# Patient Record
Sex: Male | Born: 1955 | Race: White | Hispanic: No | Marital: Married | Smoking: Never smoker
Health system: Southern US, Community
[De-identification: ages and names within clinical notes are randomized; demographics above are authoritative.]

## PROBLEM LIST (undated history)

## (undated) DIAGNOSIS — E785 Hyperlipidemia, unspecified: Secondary | ICD-10-CM

## (undated) DIAGNOSIS — C801 Malignant (primary) neoplasm, unspecified: Secondary | ICD-10-CM

## (undated) DIAGNOSIS — I10 Essential (primary) hypertension: Secondary | ICD-10-CM

## (undated) DIAGNOSIS — Z8719 Personal history of other diseases of the digestive system: Secondary | ICD-10-CM

## (undated) DIAGNOSIS — Z85828 Personal history of other malignant neoplasm of skin: Secondary | ICD-10-CM

## (undated) DIAGNOSIS — Z860100 Personal history of colon polyps, unspecified: Secondary | ICD-10-CM

## (undated) HISTORY — DX: Hyperlipidemia, unspecified: E78.5

## (undated) HISTORY — PX: POLYPECTOMY: SHX149

## (undated) HISTORY — DX: Essential (primary) hypertension: I10

## (undated) HISTORY — PX: COLONOSCOPY: SHX174

## (undated) HISTORY — DX: Malignant (primary) neoplasm, unspecified: C80.1

## (undated) HISTORY — DX: Personal history of colonic polyps: Z86.010

## (undated) HISTORY — DX: Personal history of other malignant neoplasm of skin: Z85.828

## (undated) HISTORY — PX: THYROID LOBECTOMY: SHX420

## (undated) HISTORY — DX: Personal history of colon polyps, unspecified: Z86.0100

## (undated) HISTORY — DX: Personal history of other diseases of the digestive system: Z87.19

---

## 2003-09-09 ENCOUNTER — Encounter: Admission: RE | Admit: 2003-09-09 | Discharge: 2003-09-09 | Payer: Self-pay | Admitting: Family Medicine

## 2003-09-09 ENCOUNTER — Encounter: Payer: Self-pay | Admitting: Family Medicine

## 2004-07-02 ENCOUNTER — Encounter: Admission: RE | Admit: 2004-07-02 | Discharge: 2004-07-02 | Payer: Self-pay | Admitting: Internal Medicine

## 2005-10-04 ENCOUNTER — Ambulatory Visit: Payer: Self-pay | Admitting: Internal Medicine

## 2005-10-21 ENCOUNTER — Ambulatory Visit: Payer: Self-pay | Admitting: Internal Medicine

## 2006-01-13 ENCOUNTER — Ambulatory Visit: Payer: Self-pay | Admitting: Internal Medicine

## 2007-03-02 ENCOUNTER — Ambulatory Visit: Payer: Self-pay | Admitting: Internal Medicine

## 2007-03-06 ENCOUNTER — Encounter: Payer: Self-pay | Admitting: Internal Medicine

## 2007-08-28 ENCOUNTER — Ambulatory Visit: Payer: Self-pay | Admitting: Internal Medicine

## 2007-09-02 LAB — CONVERTED CEMR LAB
ALT: 24 units/L (ref 0–53)
AST: 20 units/L (ref 0–37)
HDL: 41.1 mg/dL (ref >39.0)
Total CHOL/HDL Ratio: 4.6
Triglycerides: 210 mg/dL (ref 0–149)

## 2007-09-03 ENCOUNTER — Encounter (INDEPENDENT_AMBULATORY_CARE_PROVIDER_SITE_OTHER): Payer: Self-pay | Admitting: *Deleted

## 2007-11-20 ENCOUNTER — Ambulatory Visit: Payer: Self-pay | Admitting: Internal Medicine

## 2007-11-20 DIAGNOSIS — T887XXA Unspecified adverse effect of drug or medicament, initial encounter: Secondary | ICD-10-CM | POA: Insufficient documentation

## 2007-11-20 LAB — CONVERTED CEMR LAB: LDL Goal: 160 mg/dL

## 2007-12-20 DIAGNOSIS — C801 Malignant (primary) neoplasm, unspecified: Secondary | ICD-10-CM

## 2007-12-20 HISTORY — DX: Malignant (primary) neoplasm, unspecified: C80.1

## 2008-02-20 ENCOUNTER — Ambulatory Visit: Payer: Self-pay | Admitting: Internal Medicine

## 2008-02-20 LAB — CONVERTED CEMR LAB
AST: 22 units/L (ref 0–37)
Bilirubin, Direct: 0.1 mg/dL (ref 0.0–0.3)
HDL: 57.2 mg/dL (ref >39.0)
Total Bilirubin: 1 mg/dL (ref 0.3–1.2)
Total CHOL/HDL Ratio: 3.3
Total CK: 144 units/L (ref 7–195)
Triglycerides: 67 mg/dL (ref 0–149)
VLDL: 13 mg/dL (ref 0–40)

## 2008-02-25 ENCOUNTER — Ambulatory Visit: Payer: Self-pay | Admitting: Internal Medicine

## 2008-02-25 DIAGNOSIS — E782 Mixed hyperlipidemia: Secondary | ICD-10-CM

## 2008-02-25 DIAGNOSIS — M2550 Pain in unspecified joint: Secondary | ICD-10-CM | POA: Insufficient documentation

## 2008-02-25 DIAGNOSIS — Z85828 Personal history of other malignant neoplasm of skin: Secondary | ICD-10-CM | POA: Insufficient documentation

## 2008-02-25 LAB — CONVERTED CEMR LAB
LDL Goal: 130 mg/dL
Sed Rate: 14 mm/hr (ref 0–20)

## 2008-02-26 ENCOUNTER — Encounter (INDEPENDENT_AMBULATORY_CARE_PROVIDER_SITE_OTHER): Payer: Self-pay | Admitting: *Deleted

## 2008-07-24 ENCOUNTER — Telehealth (INDEPENDENT_AMBULATORY_CARE_PROVIDER_SITE_OTHER): Payer: Self-pay | Admitting: *Deleted

## 2008-09-16 ENCOUNTER — Telehealth (INDEPENDENT_AMBULATORY_CARE_PROVIDER_SITE_OTHER): Payer: Self-pay | Admitting: *Deleted

## 2009-01-02 ENCOUNTER — Telehealth (INDEPENDENT_AMBULATORY_CARE_PROVIDER_SITE_OTHER): Payer: Self-pay | Admitting: *Deleted

## 2009-01-21 ENCOUNTER — Telehealth (INDEPENDENT_AMBULATORY_CARE_PROVIDER_SITE_OTHER): Payer: Self-pay | Admitting: *Deleted

## 2009-01-22 ENCOUNTER — Telehealth (INDEPENDENT_AMBULATORY_CARE_PROVIDER_SITE_OTHER): Payer: Self-pay | Admitting: *Deleted

## 2009-01-26 ENCOUNTER — Ambulatory Visit: Payer: Self-pay | Admitting: Internal Medicine

## 2009-01-26 DIAGNOSIS — Z860100 Personal history of colon polyps, unspecified: Secondary | ICD-10-CM | POA: Insufficient documentation

## 2009-01-26 DIAGNOSIS — I10 Essential (primary) hypertension: Secondary | ICD-10-CM | POA: Insufficient documentation

## 2009-02-02 ENCOUNTER — Encounter (INDEPENDENT_AMBULATORY_CARE_PROVIDER_SITE_OTHER): Payer: Self-pay | Admitting: *Deleted

## 2009-02-06 ENCOUNTER — Ambulatory Visit: Payer: Self-pay | Admitting: Gastroenterology

## 2009-02-25 ENCOUNTER — Ambulatory Visit: Payer: Self-pay | Admitting: Gastroenterology

## 2009-02-25 ENCOUNTER — Encounter: Payer: Self-pay | Admitting: Gastroenterology

## 2009-02-27 ENCOUNTER — Encounter: Payer: Self-pay | Admitting: Gastroenterology

## 2010-02-04 ENCOUNTER — Telehealth (INDEPENDENT_AMBULATORY_CARE_PROVIDER_SITE_OTHER): Payer: Self-pay | Admitting: *Deleted

## 2010-03-02 ENCOUNTER — Ambulatory Visit: Payer: Self-pay | Admitting: Internal Medicine

## 2010-03-02 DIAGNOSIS — K219 Gastro-esophageal reflux disease without esophagitis: Secondary | ICD-10-CM

## 2010-11-15 ENCOUNTER — Ambulatory Visit: Payer: Self-pay | Admitting: Internal Medicine

## 2010-11-15 DIAGNOSIS — J069 Acute upper respiratory infection, unspecified: Secondary | ICD-10-CM | POA: Insufficient documentation

## 2010-11-15 DIAGNOSIS — H9209 Otalgia, unspecified ear: Secondary | ICD-10-CM | POA: Insufficient documentation

## 2010-11-15 DIAGNOSIS — M542 Cervicalgia: Secondary | ICD-10-CM

## 2010-11-16 LAB — CONVERTED CEMR LAB
Basophils Relative: 0.4 % (ref 0.0–3.0)
Eosinophils Relative: 2.3 % (ref 0.0–5.0)
HCT: 42.8 % (ref 39.0–52.0)
Hemoglobin: 14.6 g/dL (ref 13.0–17.0)
Lymphs Abs: 1.3 10*3/uL (ref 0.7–4.0)
MCV: 93.7 fL (ref 78.0–100.0)
Monocytes Absolute: 0.5 10*3/uL (ref 0.1–1.0)
RBC: 4.57 M/uL (ref 4.22–5.81)
Total CK: 155 units/L (ref 7–232)
WBC: 7.2 10*3/uL (ref 4.5–10.5)

## 2011-01-07 ENCOUNTER — Encounter: Payer: Self-pay | Admitting: Internal Medicine

## 2011-01-16 LAB — CONVERTED CEMR LAB
ALT: 27 units/L (ref 0–40)
AST: 19 units/L (ref 0–37)
AST: 25 units/L (ref 0–37)
AST: 27 units/L (ref 0–37)
Albumin: 3.6 g/dL (ref 3.5–5.2)
Alkaline Phosphatase: 68 units/L (ref 39–117)
Alkaline Phosphatase: 77 units/L (ref 39–117)
Alkaline Phosphatase: 91 units/L (ref 39–117)
BUN: 12 mg/dL (ref 6–23)
Basophils Absolute: 0 10*3/uL (ref 0.0–0.1)
Basophils Absolute: 0 10*3/uL (ref 0.0–0.1)
Basophils Absolute: 0.1 10*3/uL (ref 0.0–0.1)
Basophils Relative: 0.2 % (ref 0.0–1.0)
Bilirubin, Direct: 0.1 mg/dL (ref 0.0–0.3)
CO2: 30 meq/L (ref 19–32)
Calcium: 8.9 mg/dL (ref 8.4–10.5)
Calcium: 9 mg/dL (ref 8.4–10.5)
Chloride: 103 meq/L (ref 96–112)
Chloride: 106 meq/L (ref 96–112)
Cholesterol: 183 mg/dL (ref 0–200)
Creatinine, Ser: 1 mg/dL (ref 0.4–1.5)
Creatinine, Ser: 1 mg/dL (ref 0.4–1.5)
Eosinophils Absolute: 0.1 10*3/uL (ref 0.0–0.7)
GFR calc non Af Amer: 83 mL/min
GFR calc non Af Amer: 93.16 mL/min (ref >60)
Glucose, Bld: 80 mg/dL (ref 70–99)
HDL goal, serum: 40 mg/dL
HDL: 54 mg/dL (ref >39.00)
HDL: 57.7 mg/dL (ref >39.0)
Hemoglobin: 14 g/dL (ref 13.0–17.0)
Hemoglobin: 14.4 g/dL (ref 13.0–17.0)
LDL Cholesterol: 113 mg/dL — ABNORMAL HIGH (ref 0–99)
Lymphocytes Relative: 32.5 % (ref 12.0–46.0)
Lymphocytes Relative: 33.8 % (ref 12.0–46.0)
MCHC: 33.9 g/dL (ref 30.0–36.0)
MCHC: 34.4 g/dL (ref 30.0–36.0)
MCV: 91.4 fL (ref 78.0–100.0)
Monocytes Absolute: 0.7 10*3/uL (ref 0.2–0.7)
Monocytes Relative: 8.4 % (ref 3.0–11.0)
Monocytes Relative: 8.6 % (ref 3.0–12.0)
Neutro Abs: 2.8 10*3/uL (ref 1.4–7.7)
Neutrophils Relative %: 55.5 % (ref 43.0–77.0)
PSA: 0.19 ng/mL (ref 0.10–4.00)
PSA: 0.21 ng/mL (ref 0.10–4.00)
Platelets: 274 10*3/uL (ref 150.0–400.0)
Platelets: 279 10*3/uL (ref 150–400)
Platelets: 280 10*3/uL (ref 150–400)
Potassium: 4.1 meq/L (ref 3.5–5.1)
Potassium: 4.1 meq/L (ref 3.5–5.1)
RBC: 4.54 M/uL (ref 4.22–5.81)
RDW: 12.5 % (ref 11.5–14.6)
RDW: 12.8 % (ref 11.5–14.6)
Sodium: 139 meq/L (ref 135–145)
Sodium: 140 meq/L (ref 135–145)
Total Bilirubin: 0.7 mg/dL (ref 0.3–1.2)
Total Bilirubin: 0.9 mg/dL (ref 0.3–1.2)
Total Bilirubin: 1 mg/dL (ref 0.3–1.2)
Total Protein: 7 g/dL (ref 6.0–8.3)
Triglycerides: 115 mg/dL (ref 0.0–149.0)
VLDL: 12 mg/dL (ref 0–40)
VLDL: 23 mg/dL (ref 0.0–40.0)

## 2011-01-20 NOTE — Assessment & Plan Note (Signed)
Summary: med refill/kdc   Vital Signs:  Patient profile:   55 year old male Height:      70.75 inches Weight:      208.8 pounds BMI:     29.43 Temp:     98.4 degrees F oral Pulse rate:   60 / minute Resp:     14 per minute BP sitting:   128 / 84  (left arm) Cuff size:   large  Vitals Entered By: Shonna Chock (March 02, 2010 11:10 AM)  Comments REVIEWED MED LIST, PATIENT AGREED DOSE AND INSTRUCTION CORRECT    History of Present Illness: Russell Gonzalez is here for a physical; his BP has been well controlled in context of increased CVE , including  training for a 1/2 marathon.  Hypertension History:      He denies headache, chest pain, palpitations, dyspnea with exertion, orthopnea, PND, peripheral edema, visual symptoms, neurologic problems, syncope, and side effects from treatment.  Further comments include: BP averaging 120-124/78-84.        Positive major cardiovascular risk factors include male age 39 years old or older, hyperlipidemia, and hypertension.  Negative major cardiovascular risk factors include no history of diabetes, negative family history for ischemic heart disease, and non-tobacco-user status.        Further assessment for target organ damage reveals no history of ASHD, stroke/TIA, or peripheral vascular disease.     Allergies (verified): No Known Drug Allergies  Past History:  Past Medical History: Hyperlipidemia Skin cancer, PMH  of ,basal cell X 1, Dr Mayford Knife Colonic polyps,PMH  of adenomatous ,2005  & 2010 Dr Jarold Motto Hypertension GERD, PMH of  Past Surgical History: Colon polypectomy 2005 & 2010, due 2013, Dr Jarold Motto Upper endo 2005 : HH, eqivocal H. 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    Physical Exam  General:  Well-developed,well-nourished; alert,appropriate and cooperative throughout examination Head:  Normocephalic and atraumatic without obvious abnormalities. No apparent alopecia  Eyes:  No corneal or conjunctival inflammation noted. Perrla. Funduscopic exam benign, without hemorrhages, exudates or papilledema. Ears:  External ear exam shows no significant lesions or deformities.  Otoscopic examination reveals clear canals, tympanic membranes are intact bilaterally without bulging, retraction, inflammation or discharge. Hearing is grossly normal bilaterally. Nose:  External nasal examination shows no deformity or inflammation. Nasal mucosa are pink and moist without lesions or exudates. Mouth:  Oral mucosa and oropharynx without lesions or exudates.  Teeth in good repair. Neck:  No deformities, masses, or tenderness noted. Lungs:  Normal respiratory effort, chest expands symmetrically. Lungs are clear to auscultation, no crackles or wheezes. Heart:  regular rhythm, no gallop, no rub, no JVD, no HJR, bradycardia, and grade 1/2 /6 systolic murmur.   Abdomen:  Bowel sounds positive,abdomen soft and non-tender without masses, organomegaly or hernias noted. Rectal:  No external abnormalities noted. Normal sphincter tone. No rectal masses or tenderness. Genitalia:  Testes bilaterally descended without nodularity, tenderness or masses. No scrotal masses or lesions. No penis lesions or urethral discharge. L varicocele.   Prostate:  Prostate  gland firm and smooth, no enlargement, nodularity, tenderness, mass, asymmetry or  induration. Msk:  No deformity or scoliosis noted of thoracic or lumbar spine.   Pulses:  R and L carotid,radial,dorsalis pedis and posterior tibial pulses are full and equal bilaterally Extremities:  No clubbing, cyanosis, edema, or deformity noted with normal full range of motion of all joints.   Neurologic:  alert & oriented X3 and DTRs symmetrical and normal.   Skin:  Intact without suspicious lesions or rashes Cervical Nodes:  No lymphadenopathy noted Axillary Nodes:  No palpable lymphadenopathy Inguinal Nodes:  No significant adenopathy Psych:  Oriented X3, memory intact for recent and remote, normally interactive, and good eye contact.     Impression & Recommendations:  Problem # 1:  ROUTINE GENERAL MEDICAL EXAM@HEALTH  CARE FACL (ICD-V70.0)  Orders: EKG w/ Interpretation (93000) Venipuncture (60454) TLB-Lipid Panel (80061-LIPID) TLB-BMP (Basic Metabolic Panel-BMET) (80048-METABOL) TLB-CBC Platelet - w/Differential (85025-CBCD) TLB-Hepatic/Liver Function Pnl (80076-HEPATIC) TLB-TSH (Thyroid Stimulating Hormone) (84443-TSH) TLB-PSA (Prostate Specific Antigen) (84153-PSA)  Problem # 2:  HYPERLIPIDEMIA (ICD-272.2)  His updated medication list for this problem includes:    Crestor 10 Mg Tabs (Rosuvastatin calcium) : Note: off Crestor  x 1 month; he had been on 3 days / week  Orders: Venipuncture (09811) TLB-Lipid Panel (80061-LIPID)  Problem # 3:  HYPERTENSION (ICD-401.9)  controlled His updated medication list for this problem includes:    Hydrochlorothiazide 25 Mg Tabs (Hydrochlorothiazide) .Marland Kitchen... 1/2-1  tablet once daily    Metoprolol Tartrate 50 Mg Tabs (Metoprolol tartrate) .Marland Kitchen... 1 once daily if bp averages > 130/85  Orders: EKG w/ Interpretation (93000) Venipuncture (91478)  Problem # 4:  COLONIC POLYPS, HX OF (ICD-V12.72) as per Nyra Capes  Problem # 5:  GERD (ICD-530.81)  controlled with as needed Nexium His updated medication list for this problem  includes:    Nexium 40 Mg Cpdr (Esomeprazole magnesium) .Marland Kitchen... 1 by mouth once daily prn  Orders: Venipuncture (29562)  Complete Medication List: 1)  Hydrochlorothiazide 25 Mg Tabs (Hydrochlorothiazide) .... 1/2-1  tablet once daily 2)  Nexium 40 Mg Cpdr (Esomeprazole magnesium) .Marland Kitchen.. 1 by mouth once daily prn 3)  Crestor 10 Mg Tabs (Rosuvastatin calcium) .... Daily as directed 4)  Metoprolol Tartrate 50 Mg Tabs (Metoprolol tartrate) .Marland Kitchen.. 1 once daily if bp averages > 130/85  Hypertension Assessment/Plan:      The patient's hypertensive risk group is category B: At least one risk factor (excluding diabetes) with no target organ damage.  His calculated 10 year risk of coronary heart disease is 7 %.  Today's blood pressure is 128/84.    Patient Instructions: 1)  Recommendations pending Lab results. Prescriptions: CRESTOR 10 MG  TABS (ROSUVASTATIN CALCIUM) daily as directed  #30 x 5   Entered and Authorized by:   Marga Melnick MD   Signed by:   Marga Melnick MD on 03/02/2010   Method used:   Print then Give to Patient   RxID:   [id number]   Appended Document: med refill/kdc  Laboratory Results   Urine Tests   Date/Time Reported: March 02, 2010 1:04 PM   Routine Urinalysis   Color: yellow Appearance: Clear Glucose: negative   (Normal Range: Negative) Bilirubin: negative   (Normal Range: Negative) Ketone: negative   (Normal Range: Negative) Spec. Gravity: <1.005   (Normal Range: 1.003-1.035) Blood: negative   (Normal Range: Negative) pH: 6.0   (Normal Range: 5.0-8.0) Protein: negative   (Normal Range: Negative) Urobilinogen: 0.2   (Normal Range: 0-1) Nitrite: negative   (  Normal Range: Negative) Leukocyte Esterace: negative   (Normal Range: Negative)    Comments: Doristine Devoid  March 02, 2010 1:04 PM

## 2011-01-20 NOTE — Progress Notes (Signed)
Summary: needs ov  Phone Note Outgoing Call Call back at [facility] Phone [phone] Call back at Work Phone [phone]   Summary of Call: NEEDS OFFICE VISIT WITH DR HOPPER Shary Decamp  February 04, 2010 9:11 AM     Additional Follow-up for Phone Call Additional follow up Details #2::    LMTCB.Marland KitchenMarland KitchenBarb Merino  February 04, 2010 9:28 AM   patient has an appt on March 15,2011   Follow-up by: Barb Merino,  February 04, 2010 9:28 AM

## 2011-01-20 NOTE — Assessment & Plan Note (Signed)
Summary: SINUS INFECTION/HIGH BP//PH   Vital Signs:  Patient profile:   55 year old male Height:      70.75 inches (179.71 cm) Weight:      199 pounds (90.45 kg) BMI:     28.05 Temp:     98.5 degrees F (36.94 degrees C) oral Resp:     14 per minute BP sitting:   122 / 88  (left arm) Cuff size:   regular  Vitals Entered By: Lucious Groves CMA (November 15, 2010 12:25 PM) CC: C/O possible sinus infection and increased BP./kb, URI symptoms Is Patient Diabetic? No Pain Assessment Patient in pain? no      Comments Patient notes that he has been having sore throat, HA, and ear ache. He denies fever, cough, mucous production, and SOB./kb   CC:  C/O possible sinus infection and increased BP./kb and URI symptoms.  History of Present Illness: URI Symptoms      This is a 55 year old man who presents with URI symptoms since mid Nov as earache , posterior neck pain & frontal headache.  The patient now  reports slight sore throat and  ongoing neck pain ( better with massage) & earache, but denies nasal congestion, purulent nasal discharge, and productive cough.  The patient denies fever, dyspnea, and wheezing.  The patient denies the following risk factors for Strep sinusitis: unilateral facial pain, tooth pain, and tender adenopathy.  Rx: decongestant. He was treated with antibiotics & ? steroid shot 1st week in Nov @ [facility] for earache  .He improved until this episode.  Current Medications (verified): 1)  Hydrochlorothiazide 25 Mg  Tabs (Hydrochlorothiazide) .... 1/2-1  Tablet Once Daily 2)  Nexium 40 Mg  Cpdr (Esomeprazole Magnesium) .Marland Kitchen.. 1 By Mouth Once Daily Prn 3)  Crestor 10 Mg  Tabs (Rosuvastatin Calcium) .... Once Daily 4)  Metoprolol Tartrate 50 Mg Tabs (Metoprolol Tartrate) .Marland Kitchen.. 1 Once Daily If Bp Averages > 130/85  Allergies (verified): No Known Drug Allergies  Review of Systems MS:  Denies muscle aches and muscle weakness; All pain is in ears & neck. Allergy:  Denies itching eyes  and sneezing.  Physical Exam  General:  well-nourished,in no acute distress; alert,appropriate and cooperative throughout examination Ears:  External ear exam shows no significant lesions or deformities.  Otoscopic examination reveals clear canals, tympanic membranes are intact bilaterally without bulging, retraction, inflammation or discharge. Hearing is grossly normal bilaterally. Nose:  External nasal examination shows no deformity or inflammation. Nasal mucosa are pink and moist without lesions or exudates. Mouth:  Oral mucosa and oropharynx without lesions or exudates.  Teeth in good repair. No TMJ Neck:  No deformities, masses, or tenderness noted. Full ROM Lungs:  Normal respiratory effort, chest expands symmetrically. Lungs are clear to auscultation, no crackles or wheezes. Cervical Nodes:  No lymphadenopathy noted Axillary Nodes:  No palpable lymphadenopathy   Impression & Recommendations:  Problem # 1:  URI (ICD-465.9)  Orders: Venipuncture (19147) TLB-CBC Platelet - w/Differential (85025-CBCD)  Problem # 2:  EAR PAIN, BILATERAL (ICD-388.70)  exam unremarkable  Orders: TLB-CBC Platelet - w/Differential (85025-CBCD)  His updated medication list for this problem includes:    Azithromycin 250 Mg Tabs (Azithromycin) .Marland Kitchen... As per pack  Problem # 3:  NECK PAIN (ICD-723.1)  neg exam  Orders: Venipuncture (82956) TLB-Sedimentation Rate (ESR) (85652-ESR) TLB-CK Total Only(Creatine Kinase/CPK) (82550-CK)  His updated medication list for this problem includes:    Tramadol Hcl 50 Mg Tabs (Tramadol hcl) .Marland Kitchen... 1 every 6  hrs as needed pain  Complete Medication List: 1)  Hydrochlorothiazide 25 Mg Tabs (Hydrochlorothiazide) .... 1/2-1  tablet once daily 2)  Nexium 40 Mg Cpdr (Esomeprazole magnesium) .Marland Kitchen.. 1 by mouth once daily prn 3)  Crestor 10 Mg Tabs (Rosuvastatin calcium) .... Once daily 4)  Metoprolol Tartrate 50 Mg Tabs (Metoprolol tartrate) .Marland Kitchen.. 1 once daily if bp  averages > 130/85 5)  Tramadol Hcl 50 Mg Tabs (Tramadol hcl) .Marland Kitchen.. 1 every 6 hrs as needed pain 6)  Azithromycin 250 Mg Tabs (Azithromycin) .... As per pack 7)  Fluticasone Propionate 50 Mcg/act Susp (Fluticasone propionate) .Marland Kitchen.. 1 spray two times a day as needed  Patient Instructions: 1)  Stop decongestants. Neti pot once daily as needed for head congestion. Prescriptions: FLUTICASONE PROPIONATE 50 MCG/ACT SUSP (FLUTICASONE PROPIONATE) 1 spray two times a day as needed  #1 x 2   Entered and Authorized by:   Marga Melnick MD   Signed by:   Marga Melnick MD on 11/15/2010   Method used:   Faxed to ...       [facility]* (retail)       [address] [place], [place]  [id number]       Ph: [phone]       Fax: [phone]   RxID:   [id number] AZITHROMYCIN 250 MG TABS (AZITHROMYCIN) as per pack  #1 x 0   Entered and Authorized by:   Marga Melnick MD   Signed by:   Marga Melnick MD on 11/15/2010   Method used:   Faxed to ...       [facility]* (retail)       [address] [place], [place]  [id number]       Ph: [phone]       Fax: [phone]   RxID:   [id number] TRAMADOL HCL 50 MG TABS (TRAMADOL HCL) 1 every 6 hrs as needed pain  #30 x 0   Entered and Authorized by:   Marga Melnick MD   Signed by:   Marga Melnick MD on 11/15/2010   Method used:   Faxed to ...       [facility]* (retail)       [address] [place], [place]  [address]       Ph: [phone]       Fax: [phone]   RxID:   [id number]    Orders Added: 1)  Est. Patient Level III [54627] 2)  Venipuncture [03500] 3)  TLB-CBC Platelet - w/Differential [85025-CBCD] 4)  TLB-Sedimentation Rate (ESR) [85652-ESR] 5)  TLB-CK Total Only(Creatine Kinase/CPK) [82550-CK]  Appended Document: SINUS INFECTION/HIGH BP//PH

## 2011-03-09 ENCOUNTER — Encounter: Payer: Self-pay | Admitting: Internal Medicine

## 2011-03-09 ENCOUNTER — Ambulatory Visit (INDEPENDENT_AMBULATORY_CARE_PROVIDER_SITE_OTHER): Payer: Commercial Managed Care - PPO | Admitting: Internal Medicine

## 2011-03-09 VITALS — BP 130/90 | HR 64 | Temp 98.4°F | Resp 16 | Ht 71.5 in | Wt 199.0 lb

## 2011-03-09 DIAGNOSIS — Z136 Encounter for screening for cardiovascular disorders: Secondary | ICD-10-CM

## 2011-03-09 DIAGNOSIS — Z0000 Encounter for general adult medical examination without abnormal findings: Secondary | ICD-10-CM

## 2011-03-09 LAB — POCT URINALYSIS DIPSTICK
Bilirubin, UA: NEGATIVE
Glucose, UA: NEGATIVE
Leukocytes, UA: NEGATIVE
Nitrite, UA: NEGATIVE
Urobilinogen, UA: NEGATIVE

## 2011-03-09 LAB — COMPREHENSIVE METABOLIC PANEL
AST: 20 U/L (ref 0–37)
Albumin: 4.2 g/dL (ref 3.5–5.2)
Alkaline Phosphatase: 79 U/L (ref 39–117)
BUN: 14 mg/dL (ref 6–23)
Potassium: 4.2 mEq/L (ref 3.5–5.1)
Total Bilirubin: 0.6 mg/dL (ref 0.3–1.2)

## 2011-03-09 LAB — LIPID PANEL
Cholesterol: 190 mg/dL (ref 0–200)
HDL: 53.5 mg/dL (ref >39.00)
LDL Cholesterol: 126 mg/dL — ABNORMAL HIGH (ref 0–99)
VLDL: 10.2 mg/dL (ref 0.0–40.0)

## 2011-03-09 LAB — TSH: TSH: 1.25 u[IU]/mL (ref 0.35–5.50)

## 2011-03-09 LAB — PSA: PSA: 0.25 ng/mL (ref 0.10–4.00)

## 2011-03-09 MED ORDER — HYDROCHLOROTHIAZIDE 25 MG PO TABS: 25.0000 mg | ORAL_TABLET | Freq: Every day | ORAL | Status: DC

## 2011-03-09 MED ORDER — ESOMEPRAZOLE MAGNESIUM 40 MG PO CPDR: 40.0000 mg | DELAYED_RELEASE_CAPSULE | Freq: Every day | ORAL | Status: DC

## 2011-03-09 NOTE — Progress Notes (Signed)
  Subjective:    Patient ID: Russell Gonzalez, male    DOB: [birth year], 55 y.o.   MRN: [id number]  HPI  Russell Gonzalez ) is here for a complete physical examination. He is asymptomatic. He completed a marathon 2 weeks ago.   His blood pressure averages in the high 120s over 80s at home.He stopped his statin in November during the period he was training for the marathon. He denies chest pain, palpitations, exertional dyspnea, pedal edema .  He is on antihypertensive medications; he denies headaches, nosebleeds, or orthostatic symptoms.     Review of Systems  Constitutional: Negative for fatigue and unexpected weight change.  HENT: Negative for hearing loss, sneezing and tinnitus.   Eyes: Negative for visual disturbance.  Respiratory: Negative for cough, shortness of breath and wheezing.   Gastrointestinal: Negative for abdominal pain, diarrhea, constipation and blood in stool.  Genitourinary: Negative for dysuria, frequency and hematuria.  Musculoskeletal: Negative for myalgias, back pain, joint swelling and arthralgias.  Skin: Negative for rash.  Neurological: Negative for dizziness, syncope, light-headedness and headaches.  Hematological: Negative for adenopathy.  Psychiatric/Behavioral: Negative for sleep disturbance. The patient is not nervous/anxious.        Objective:   Physical Exam  Constitutional: He is oriented to person, place, and time. He appears well-developed.  HENT:  Head: Normocephalic.  Right Ear: External ear normal.  Left Ear: External ear normal.  Nose: Nose normal.  Mouth/Throat: Oropharynx is clear and moist.  Eyes: Conjunctivae and EOM are normal. Pupils are equal, round, and reactive to light.  Neck: No thyromegaly present.  Cardiovascular: Regular rhythm, normal heart sounds and intact distal pulses.  Exam reveals no gallop and no friction rub.   No murmur heard.       Bradycardia is present in the context of a high level of cardiovascular exercise.    Pulmonary/Chest: Effort normal and breath sounds normal.  Abdominal: Soft. Bowel sounds are normal. He exhibits no mass. There is no tenderness.  Genitourinary: Rectum normal, prostate normal and penis normal.  Musculoskeletal: Normal range of motion. He exhibits no edema.  Lymphadenopathy:    He has no cervical adenopathy.  Neurological: He is alert and oriented to person, place, and time. He has normal reflexes.  Skin: Skin is warm and dry. No rash noted.  Psychiatric: He has a normal mood and affect. Judgment normal.           Assessment & Plan:   #1 no active issues found on comprehensive evaluation. 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    #2 hyperlipidemia ; advanced cholesterol testing will be employed to assess need for statin    #3 hypertension controlled    #4 adenomatous colon polyps ; surveillance colonoscopy due 2013.     #5 GERD controlled ; Nexium as needed.   Plan : #1 Nexium and hydrochlorothiazide renewed. Need for Crestor will be determined after review of advanced cholesterol testing. The metoprolol was when necessary and has not been needed.  and

## 2011-03-09 NOTE — Patient Instructions (Signed)
Review of the lab results it will be determined whether a statin is appropriate. Your physical exam reveals no acute issues and no changes will be made at this time.

## 2011-03-14 NOTE — Progress Notes (Signed)
Addended by: Floydene Flock on: 03/14/2011 03:24 PM   Modules accepted: Orders

## 2011-03-14 NOTE — Progress Notes (Signed)
Addended by: Floydene Flock on: 03/14/2011 03:12 PM   Modules accepted: Orders

## 2011-03-14 NOTE — Progress Notes (Signed)
Addended by: Floydene Flock on: 03/14/2011 03:34 PM   Modules accepted: Orders

## 2011-03-14 NOTE — Progress Notes (Signed)
Addended by: Floydene Flock on: 03/14/2011 03:27 PM   Modules accepted: Orders

## 2011-03-15 ENCOUNTER — Encounter: Payer: Self-pay | Admitting: Internal Medicine

## 2011-03-29 ENCOUNTER — Encounter: Payer: Self-pay | Admitting: Internal Medicine

## 2012-02-28 ENCOUNTER — Telehealth: Payer: Self-pay

## 2012-02-28 DIAGNOSIS — Z0000 Encounter for general adult medical examination without abnormal findings: Secondary | ICD-10-CM

## 2012-02-28 MED ORDER — ESOMEPRAZOLE MAGNESIUM 40 MG PO CPDR: 40.0000 mg | DELAYED_RELEASE_CAPSULE | Freq: Every day | ORAL | Status: DC

## 2012-02-28 NOTE — Telephone Encounter (Signed)
Rx request for Nexium.

## 2012-03-12 ENCOUNTER — Encounter: Payer: Self-pay | Admitting: Gastroenterology

## 2012-03-14 ENCOUNTER — Other Ambulatory Visit: Payer: Self-pay | Admitting: Internal Medicine

## 2012-03-14 DIAGNOSIS — Z0000 Encounter for general adult medical examination without abnormal findings: Secondary | ICD-10-CM

## 2012-03-14 MED ORDER — HYDROCHLOROTHIAZIDE 25 MG PO TABS: 25.0000 mg | ORAL_TABLET | Freq: Every day | ORAL | Status: DC

## 2012-03-14 NOTE — Telephone Encounter (Signed)
Patient needs to schedule a CPX  

## 2012-03-14 NOTE — Telephone Encounter (Signed)
Refill for  Hydrocholorot 25MG   Qty 90 Take 1/2 to 1 tablet daily  Last fill 12.22.12

## 2012-06-13 ENCOUNTER — Telehealth: Payer: Self-pay | Admitting: Internal Medicine

## 2012-06-13 DIAGNOSIS — Z0000 Encounter for general adult medical examination without abnormal findings: Secondary | ICD-10-CM

## 2012-06-13 MED ORDER — HYDROCHLOROTHIAZIDE 25 MG PO TABS: 25.0000 mg | ORAL_TABLET | Freq: Every day | ORAL | Status: DC

## 2012-06-13 NOTE — Telephone Encounter (Signed)
Refill: Hydrochlorot 25mg . Take 1/2 to 1 tablet daily. Qty 90. Last fill 03-14-12

## 2012-06-13 NOTE — Telephone Encounter (Signed)
Patient needs to schedule a CPX  

## 2012-08-22 ENCOUNTER — Other Ambulatory Visit: Payer: Self-pay | Admitting: Internal Medicine

## 2012-08-22 DIAGNOSIS — Z0000 Encounter for general adult medical examination without abnormal findings: Secondary | ICD-10-CM

## 2012-08-22 MED ORDER — HYDROCHLOROTHIAZIDE 25 MG PO TABS: 25.0000 mg | ORAL_TABLET | Freq: Every day | ORAL | Status: DC

## 2012-08-22 NOTE — Telephone Encounter (Signed)
Refill Hydrochlorothiazide (Tab) HYDRODIURIL 25 MG Take 1 tablet (25 mg total) by mouth daily. 1/2-1 by mouth daily # 30 Last fill 8.2.13---NOTED appt overdue *nothing scheduled for future appts  last ov 3.21.12 V70

## 2012-08-22 NOTE — Telephone Encounter (Signed)
Patient needs to schedule a CPX  

## 2012-09-19 ENCOUNTER — Encounter: Payer: Self-pay | Admitting: Internal Medicine

## 2012-09-19 ENCOUNTER — Telehealth: Payer: Self-pay | Admitting: Internal Medicine

## 2012-09-19 ENCOUNTER — Ambulatory Visit (INDEPENDENT_AMBULATORY_CARE_PROVIDER_SITE_OTHER): Payer: Commercial Managed Care - PPO | Admitting: Internal Medicine

## 2012-09-19 VITALS — BP 134/82 | HR 97 | Temp 98.8°F | Resp 15 | Wt 201.6 lb

## 2012-09-19 DIAGNOSIS — R509 Fever, unspecified: Secondary | ICD-10-CM

## 2012-09-19 DIAGNOSIS — R52 Pain, unspecified: Secondary | ICD-10-CM

## 2012-09-19 DIAGNOSIS — J111 Influenza due to unidentified influenza virus with other respiratory manifestations: Secondary | ICD-10-CM

## 2012-09-19 MED ORDER — OSELTAMIVIR PHOSPHATE 75 MG PO CAPS: 75.0000 mg | ORAL_CAPSULE | Freq: Two times a day (BID) | ORAL | Status: DC

## 2012-09-19 NOTE — Progress Notes (Signed)
  Subjective:    Patient ID: Russell Gonzalez, male    DOB: [birth year], 56 y.o.   MRN: [id number]  HPI   Symptoms began approximately 11 PM as being chilled and associated with diffuse myalgias. He describes pain in his neck as well as the back.  He took  Tylenol and was able to go to sleep. He awoke one-2 AM with high fever & sweats. This morning he had nausea and vomiting x2  He now describes a diffuse headache and generalized malaise    Review of Systems  He specifically denies frontal headache, facial pain, nasal purulence, cough, sputum production, diarrhea,dyuria, pyuria, or hematuria.     Objective:   Physical Exam General appearance:appears fatigued but well nourished; no acute distress or increased work of breathing is present.  No  lymphadenopathy about the head, neck, or axilla noted.   Eyes: No conjunctival inflammation or lid edema is present.   Ears:  External ear exam shows no significant lesions or deformities.  Otoscopic examination reveals clear canals, tympanic membranes are intact bilaterally without bulging, retraction, inflammation or discharge.  Nose:  External nasal examination shows no deformity or inflammation. Nasal mucosa are pink and moist without lesions or exudates. No septal dislocation or deviation.No obstruction to airflow.   Oral exam: Dental hygiene is good; lips and gums are healthy appearing.There is no oropharyngeal erythema or exudate noted.   Neck:  No deformities,  masses, or tenderness noted.   Supple with full range of motion without pain. No nucchal rigidity  Heart:  Normal rate and regular rhythm. S1 and S2 normal without  click or  Rub. S4 gallop with slight flow murmur.   Lungs:Chest clear to auscultation; no wheezes, rhonchi,rales ,or rubs present.No increased work of breathing.    Extremities:  No cyanosis, edema, or clubbing  noted    Skin: Hot & dry w/o jaundice or tenting.          Assessment & Plan:  #1 viral syndrome,  R/O Flu Plan: See orders and recommendations

## 2012-09-19 NOTE — Patient Instructions (Addendum)
NSAIDS ( Aleve, Advil, Naproxen) or Tylenol every 4 hrs as needed for fever as discussed based on label recommendations .Stay well hydrated. Drink  8 ounces of fluids every hour while awake.Jello, sherbert (NOT ice cream), Lipton's chicken noodle soup(NOT cream based soups),Gatorade Lite, flat Ginger ale (without High Fructose Corn Syrup),dry toast or crackers, baked potato.No milk , dairy or grease until bowels are formed.  Vitamin C 2000 mg daily; & Echinacea for 4-7 days. Report intractable fever, exudate("pus") or progressive pain.  Nasal cleansing in the shower as discussed. Make sure that all residual soap is removed to prevent irritation.

## 2012-09-19 NOTE — Telephone Encounter (Signed)
Caller: Teresa/Spouse; Patient Name: Russell Gonzalez; PCP: Marga Melnick; Best Callback Phone Number: [phone]  09-19-12 onset of fever, chills, aching all over and fatigue.  Temp 102.  All emergent sxs per Flu Like Symptoms ruled out except for In High risk group for complications of influenza and has questions/concerns   Home care advice given   Appt made for today at 1630 with Dr Alwyn Ren

## 2012-09-19 NOTE — Telephone Encounter (Signed)
Patient with appointment today. Per Dr.Hopper's protocol if appointment scheduled ok to close encounter

## 2012-10-03 ENCOUNTER — Other Ambulatory Visit: Payer: Self-pay | Admitting: Internal Medicine

## 2012-10-24 ENCOUNTER — Encounter: Payer: Self-pay | Admitting: Gastroenterology

## 2012-10-26 ENCOUNTER — Encounter: Payer: Self-pay | Admitting: Gastroenterology

## 2012-11-26 ENCOUNTER — Ambulatory Visit (AMBULATORY_SURGERY_CENTER): Payer: Commercial Managed Care - PPO | Admitting: *Deleted

## 2012-11-26 VITALS — Ht 72.0 in | Wt 204.0 lb

## 2012-11-26 DIAGNOSIS — Z1211 Encounter for screening for malignant neoplasm of colon: Secondary | ICD-10-CM

## 2012-11-26 MED ORDER — MOVIPREP 100 G PO SOLR: ORAL | Status: DC

## 2012-12-03 ENCOUNTER — Encounter: Payer: Commercial Managed Care - PPO | Admitting: Gastroenterology

## 2012-12-05 ENCOUNTER — Ambulatory Visit (AMBULATORY_SURGERY_CENTER): Payer: Commercial Managed Care - PPO | Admitting: Gastroenterology

## 2012-12-05 ENCOUNTER — Encounter: Payer: Self-pay | Admitting: Gastroenterology

## 2012-12-05 VITALS — BP 145/83 | HR 59 | Temp 97.2°F | Resp 18 | Ht 72.0 in | Wt 204.0 lb

## 2012-12-05 DIAGNOSIS — Z86010 Personal history of colonic polyps: Secondary | ICD-10-CM

## 2012-12-05 DIAGNOSIS — Z1211 Encounter for screening for malignant neoplasm of colon: Secondary | ICD-10-CM

## 2012-12-05 MED ORDER — SODIUM CHLORIDE 0.9 % IV SOLN: 500.0000 mL | INTRAVENOUS | Status: DC

## 2012-12-05 NOTE — Patient Instructions (Addendum)

## 2012-12-05 NOTE — Op Note (Signed)
[facility] [address] [place], [address]   COLONOSCOPY PROCEDURE REPORT  PATIENT: Russell, Gonzalez  MR#: [id number] BIRTHDATE: 08/19/1956 , 56  yrs. old GENDER: Male ENDOSCOPIST: Mardella Layman, MD, [facility] REFERRED BY: PROCEDURE DATE:  12/05/2012 PROCEDURE:   Colonoscopy, screening ASA CLASS:   Class II INDICATIONS:Patient's personal history of adenomatous colon polyps.  MEDICATIONS: propofol (Diprivan) 150mg  IV  DESCRIPTION OF PROCEDURE:   After the risks and benefits and of the procedure were explained, informed consent was obtained.  A digital rectal exam revealed no abnormalities of the rectum.    The LB [id number]  endoscope was introduced through the anus and advanced to the cecum, which was identified by both the appendix and ileocecal valve .  The quality of the prep was excellent, using MoviPrep .  The instrument was then slowly withdrawn as the colon was fully examined.     COLON FINDINGS: A normal appearing cecum, ileocecal valve, and appendiceal orifice were identified.  The ascending, hepatic flexure, transverse, splenic flexure, descending, sigmoid colon and rectum appeared unremarkable.  No polyps or cancers were seen. Retroflexed views revealed no abnormalities.     The scope was then withdrawn from the patient and the procedure completed.  COMPLICATIONS: There were no complications. ENDOSCOPIC IMPRESSION: Normal colon  RECOMMENDATIONS: Repeat Colonoscopy in 5 years.   REPEAT EXAM:  cc:Pecola Lawless, MD  _______________________________ eSigned:  Mardella Layman, MD, [facility] 12/05/2012 9:33 AM

## 2012-12-05 NOTE — Progress Notes (Signed)
Propofol given over incremental dosages 

## 2012-12-05 NOTE — Progress Notes (Signed)
YOry for 24 hours (because of the sedation medicines used during the test).    SYMPTOMS TO REPORT IMMEDIATELY:  U HAD AN ENDOSCOPIC PROCEDURE TODAY AT THE [facility]: Refer to the procedure report that was given to you for any specific questions about what was found during the examination.  If the procedure report does not answer your questions, please call your gastroenterologist to clarify.  If you requested that your care partner not be given the details of your procedure findings, then the procedure report has been included in a sealed envelope for you to review at your convenience later.  YOU SHOULD EXPECT: Some feelings of bloating in the abdomen. Passage of more gas than usual.  Walking can help get rid of the air that was put into your GI tract during the procedure and reduce the bloating. If you had a lower endoscopy (such as a colonoscopy or flexible sigmoidoscopy) you may notice spotting of blood in your stool or on the toilet paper. If you underwent a bowel prep for your procedure, then you may not have a normal bowel movement for a few days.  DIET: Your first meal following the procedure should be a light meal and then it is ok to progress to your normal diet.  A half-sandwich or bowl of soup is an example of a good first meal.  Heavy or fried foods are harder to digest and may make you feel nauseous or bloated.  Likewise meals heavy in dairy and vegetables can cause extra gas to form and this can also increase the bloating.  Drink plenty of fluids but you should avoid alcoholic beverages for 24 hours.  ACTIVITY: Your care partner should take you home directly after the procedure.  You should plan to take it easy, moving slowly for the rest of the day.  You can resume normal activity the day after the procedure however you should NOT DRIVE or use heavy machine

## 2012-12-05 NOTE — Progress Notes (Signed)
Patient did not experience any of the following events: a burn prior to discharge; a fall within the facility; wrong site/side/patient/procedure/implant event; or a hospital transfer or hospital admission upon discharge from the facility. (G8907) Patient did not have preoperative order for IV antibiotic SSI prophylaxis. (G8918)  

## 2012-12-05 NOTE — Progress Notes (Signed)
No egg or soy allergy per pt. ewm 

## 2012-12-06 ENCOUNTER — Telehealth: Payer: Self-pay

## 2012-12-06 NOTE — Telephone Encounter (Signed)
Left message on answering machine. 

## 2012-12-31 ENCOUNTER — Other Ambulatory Visit: Payer: Self-pay | Admitting: Internal Medicine

## 2013-01-11 ENCOUNTER — Ambulatory Visit (INDEPENDENT_AMBULATORY_CARE_PROVIDER_SITE_OTHER): Payer: Commercial Managed Care - PPO | Admitting: Internal Medicine

## 2013-01-11 ENCOUNTER — Encounter: Payer: Self-pay | Admitting: Internal Medicine

## 2013-01-11 VITALS — BP 136/98 | HR 68 | Temp 98.3°F | Resp 14 | Ht 71.03 in | Wt 207.0 lb

## 2013-01-11 DIAGNOSIS — Z0000 Encounter for general adult medical examination without abnormal findings: Secondary | ICD-10-CM

## 2013-01-11 DIAGNOSIS — I10 Essential (primary) hypertension: Secondary | ICD-10-CM

## 2013-01-11 DIAGNOSIS — E782 Mixed hyperlipidemia: Secondary | ICD-10-CM

## 2013-01-11 DIAGNOSIS — K219 Gastro-esophageal reflux disease without esophagitis: Secondary | ICD-10-CM

## 2013-01-11 LAB — HEPATIC FUNCTION PANEL
AST: 20 U/L (ref 0–37)
Total Bilirubin: 0.9 mg/dL (ref 0.3–1.2)

## 2013-01-11 LAB — POCT URINALYSIS DIPSTICK
Glucose, UA: NEGATIVE
Ketones, UA: NEGATIVE
Leukocytes, UA: NEGATIVE
Protein, UA: NEGATIVE
Spec Grav, UA: <=1.005
Urobilinogen, UA: 0.2

## 2013-01-11 LAB — CBC WITH DIFFERENTIAL/PLATELET
Basophils Absolute: 0.1 10*3/uL (ref 0.0–0.1)
HCT: 43.1 % (ref 39.0–52.0)
Lymphs Abs: 1.8 10*3/uL (ref 0.7–4.0)
Monocytes Relative: 9.4 % (ref 3.0–12.0)
Platelets: 292 10*3/uL (ref 150.0–400.0)
RDW: 13.1 % (ref 11.5–14.6)

## 2013-01-11 LAB — LIPID PANEL
Cholesterol: 207 mg/dL — ABNORMAL HIGH (ref 0–200)
Total CHOL/HDL Ratio: 4
Triglycerides: 96 mg/dL (ref 0.0–149.0)
VLDL: 19.2 mg/dL (ref 0.0–40.0)

## 2013-01-11 LAB — BASIC METABOLIC PANEL
BUN: 16 mg/dL (ref 6–23)
Calcium: 9.2 mg/dL (ref 8.4–10.5)
Creatinine, Ser: 1 mg/dL (ref 0.4–1.5)
GFR: 79.8 mL/min (ref >60.00)
Glucose, Bld: 86 mg/dL (ref 70–99)
Potassium: 3.8 mEq/L (ref 3.5–5.1)

## 2013-01-11 MED ORDER — ESOMEPRAZOLE MAGNESIUM 40 MG PO CPDR: 40.0000 mg | DELAYED_RELEASE_CAPSULE | Freq: Every day | ORAL | Status: DC

## 2013-01-11 MED ORDER — HYDROCHLOROTHIAZIDE 25 MG PO TABS: 25.0000 mg | ORAL_TABLET | Freq: Every day | ORAL | Status: DC

## 2013-01-11 NOTE — Patient Instructions (Addendum)
Minimal Blood Pressure Goal= AVERAGE < 140/90;  Ideal is an AVERAGE < 135/85. This AVERAGE should be calculated from @ least 5-7 BP readings taken @ different times of day on different days of week. You should not respond to isolated BP readings , but rather the AVERAGE for that week .Please bring your  blood pressure cuff to office visits to verify that it is reliable.It  can also be checked against the blood pressure device at the pharmacy. Finger or wrist cuffs are not dependable; an arm cuff is.   If you activate My Chart; the results can be released to you as soon as they populate from the lab. If you choose not to use this program; the labs have to be reviewed, copied & mailed   causing a delay in getting the results to you.  

## 2013-01-11 NOTE — Progress Notes (Signed)
  Subjective:    Patient ID: Russell Gonzalez, male    DOB: 13-May-1956, 57 y.o.   MRN: [id number]  HPI Russell Gonzalez is here for a physical he denies active or acute issues .      Review of Systems Blood pressure is monitored at home  Blood pressure average is 120s/80s  Epistaxis, headache, lightheadedness, chest pain, palpitations, dyspnea, edema, and claudication are not present  Medication compliance is good  Adverse medication effects are not present  Sodium restriction is employed; on heart healthy diet  Cardiovascular exercise 4-5X/ week & running 2-3 x/ week 3-5 mpd           Objective:   Physical Exam Gen.: Healthy and well-nourished in appearance. Alert, appropriate and cooperative throughout exam.   Head: Normocephalic without obvious abnormalities  Eyes: No corneal or conjunctival inflammation noted. Pupils equal round reactive to light and accommodation. Fundal exam is benign without hemorrhages, exudate, papilledema. Extraocular motion intact. Vision grossly normal with lenses. FOV normal Ears: External  ear exam reveals no significant lesions or deformities. Canals clear .TMs normal. Hearing is grossly normal bilaterally to whisper @6  ft. Nose: External nasal exam reveals no deformity or inflammation. Nasal mucosa are pink and moist. No lesions or exudates noted.   Mouth: Oral mucosa and oropharynx reveal no lesions or exudates. Teeth in good repair. Neck: No deformities, masses, or tenderness noted. Range of motion & Thyroid normal. Lungs: Normal respiratory effort; chest expands symmetrically. Lungs are clear to auscultation without rales, wheezes, or increased work of breathing. Heart: Normal rate and rhythm. Normal S1 and S2. No gallop, click, or rub. S4 w/o murmur. Abdomen: Bowel sounds normal; abdomen soft and nontender. No masses, organomegaly or hernias noted. Genitalia: Genitalia normal except for small left varices. Prostate not examined; colonoscopy last  month Musculoskeletal/extremities: No deformity or scoliosis noted of  the thoracic or lumbar spine. No clubbing, cyanosis, edema, or significant extremity  deformity noted. Range of motion normal .Tone & strength  normal.Joints normal. Nail health good. Able to lie down & sit up w/o help. Negative SLR bilaterally Vascular: Carotid, radial artery, dorsalis pedis and  posterior tibial pulses are full and equal. No bruits present. Neurologic: Alert and oriented x3. Deep tendon reflexes symmetrical and normal. Rhomberg & finger to nose testing normal.Gait including heel & toe walking normal.        Skin: Intact without suspicious lesions or rashes. Lymph: No cervical, axillary, or inguinal lymphadenopathy present. Psych: Mood and affect are normal. Normally interactive                                                                                         Assessment & Plan:  #1 comprehensive physical exam; no acute findings  Plan: see Orders

## 2013-01-14 ENCOUNTER — Telehealth: Payer: Self-pay

## 2013-01-14 MED ORDER — PRAVASTATIN SODIUM 20 MG PO TABS: 20.0000 mg | ORAL_TABLET | Freq: Every day | ORAL | Status: DC

## 2013-01-14 NOTE — Telephone Encounter (Signed)
Message copied by Maurice Small on Mon Jan 14, 2013  8:16 AM ------      Message from: Pecola Lawless      Created: Sun Jan 13, 2013 10:36 AM       Please send a prescription for Pravastatin 20 mg ; dispense 90, daily qhs.

## 2013-01-30 ENCOUNTER — Other Ambulatory Visit: Payer: Self-pay | Admitting: *Deleted

## 2013-01-30 MED ORDER — PRAVASTATIN SODIUM 20 MG PO TABS: 20.0000 mg | ORAL_TABLET | Freq: Every day | ORAL | Status: DC

## 2013-01-30 NOTE — Progress Notes (Signed)
Pt states that he lost Rx and would like to get another Rx sent in to pharmacy. Pt aware Rx sent.

## 2013-12-27 ENCOUNTER — Ambulatory Visit: Payer: Self-pay | Admitting: Internal Medicine

## 2014-01-17 ENCOUNTER — Other Ambulatory Visit: Payer: Self-pay | Admitting: Internal Medicine

## 2014-01-20 ENCOUNTER — Other Ambulatory Visit: Payer: Self-pay

## 2014-01-20 ENCOUNTER — Telehealth: Payer: Self-pay

## 2014-01-20 DIAGNOSIS — I10 Essential (primary) hypertension: Secondary | ICD-10-CM

## 2014-01-20 MED ORDER — HYDROCHLOROTHIAZIDE 25 MG PO TABS: 25.0000 mg | ORAL_TABLET | Freq: Every day | ORAL | Status: DC

## 2014-01-20 NOTE — Telephone Encounter (Signed)
Called requesting refill for: Hydrochlorothiazide 25 mg PO Daily  Last Visit: 01/11/13  Med refill x 1 month per protocol and client was encouraged to schedule physical as soon as possible.

## 2014-02-12 ENCOUNTER — Encounter: Payer: Self-pay | Admitting: Internal Medicine

## 2014-02-12 ENCOUNTER — Ambulatory Visit (INDEPENDENT_AMBULATORY_CARE_PROVIDER_SITE_OTHER): Payer: PRIVATE HEALTH INSURANCE | Admitting: Internal Medicine

## 2014-02-12 ENCOUNTER — Other Ambulatory Visit (INDEPENDENT_AMBULATORY_CARE_PROVIDER_SITE_OTHER): Payer: PRIVATE HEALTH INSURANCE

## 2014-02-12 VITALS — BP 148/90 | HR 88 | Temp 98.2°F | Wt 209.8 lb

## 2014-02-12 DIAGNOSIS — R51 Headache: Secondary | ICD-10-CM

## 2014-02-12 DIAGNOSIS — I10 Essential (primary) hypertension: Secondary | ICD-10-CM

## 2014-02-12 DIAGNOSIS — R232 Flushing: Secondary | ICD-10-CM

## 2014-02-12 LAB — BASIC METABOLIC PANEL
BUN: 16 mg/dL (ref 6–23)
CO2: 31 mEq/L (ref 19–32)
Calcium: 9.3 mg/dL (ref 8.4–10.5)
Chloride: 99 mEq/L (ref 96–112)
Creatinine, Ser: 1 mg/dL (ref 0.4–1.5)
GFR: 83.25 mL/min (ref >60.00)
Glucose, Bld: 83 mg/dL (ref 70–99)
POTASSIUM: 3.7 meq/L (ref 3.5–5.1)
SODIUM: 136 meq/L (ref 135–145)

## 2014-02-12 MED ORDER — HYDROCHLOROTHIAZIDE 25 MG PO TABS: 25.0000 mg | ORAL_TABLET | Freq: Every day | ORAL | Status: AC

## 2014-02-12 MED ORDER — DILTIAZEM HCL ER COATED BEADS 120 MG PO CP24: 120.0000 mg | ORAL_CAPSULE | Freq: Every day | ORAL | Status: AC

## 2014-02-12 NOTE — Patient Instructions (Signed)
Your next office appointment will be determined based upon review of your pending labs & response to medication. Those instructions will be transmitted to you through My Chart . Followup as needed for your acute issue. Please report any significant change in your symptoms .Please keep a diary of your headaches . Document  each occurrence on the calendar with notation of : #1 any prodrome ( any non headache symptom such as marked fatigue,visual changes, ,etc ) which precedes actual headache ; #2) severity on 1-10 scale; #3) any triggers ( food/ drink,enviromenntal or weather changes ,physical or emotional stress) in 8-12 hour period prior to the headache; & #4) response to any medications or other intervention. Please review "Headache" @ [facility] for additional information.   Minimal Blood Pressure Goal= AVERAGE < 140/90;  Ideal is an AVERAGE < 135/85. This AVERAGE should be calculated from @ least 5-7 BP readings taken @ different times of day on different days of week. You should not respond to isolated BP readings , but rather the AVERAGE for that week .Please bring your  blood pressure cuff to office visits to verify that it is reliable.It  can also be checked against the blood pressure device at the pharmacy. Finger or wrist cuffs are not dependable; an arm cuff is.

## 2014-02-12 NOTE — Progress Notes (Signed)
Pre visit review using our clinic review tool, if applicable. No additional management support is needed unless otherwise documented below in the visit note. 

## 2014-02-12 NOTE — Progress Notes (Signed)
   Subjective:    Patient ID: Russell Gonzalez, male    DOB: [birth year], 58 y.o.   MRN: [id number]  HPI The patient is here due to elevated BP readings at home; reports readings ranging from 123/80 to 150/95. Reports for last 3-4 weeks he has experienced headaches behind eyes, which he describes as pounding, lasting 2-15 minutes. He does not take anything for the HA.   He also describes a flushing/tingling sensation in his face. Reports  Diet is heart healthy. Exercise running 12-15 miles/week and kettle ball weight training 3/week.   Blood pressure  average 130/85-90   There is medical compliance with antihypertensive medications.   Review of Systems Constitutional: No  significant weight changes, excess fatigue Eyes: No  blurred vision, double vision, loss of vision Cardiovascular: denies chest pain, paroxysmal tachycardia or hypertension, irregular rhythm, syncope, claudication, edema Respiratory: No exertional dyspnea, paroxysmal nocturnal dyspnea Genitourinary: No dysuria,, hematuria, pyuria, frequency, incontinence, nocturia GI: denies diarrhea, changes in BM.  Musculoskeletal: No myalgias or muscle cramping  Neurologic: No  limb weakness, numbness or tingling, burning       Objective:   Physical Exam General appearance:good health ;well nourished; no acute distress or increased work of breathing is present.  No  lymphadenopathy about the head, neck, or axilla noted.   Eyes: No conjunctival inflammation or lid edema is present. There is no scleral icterus. EOM intact without pain. Field of vision normal. Fundal exam reveals no   Ears:  External ear exam shows no significant lesions or deformities.  Otoscopic examination reveals clear canals, tympanic membranes are intact bilaterally without bulging, retraction, inflammation or discharge.  Nose:  External nasal examination shows no deformity or inflammation. Nasal mucosa are pink and moist without lesions or exudates. No septal  dislocation or deviation.No obstruction to airflow.   Oral exam: Dental hygiene is good; lips and gums are healthy appearing.There is no oropharyngeal erythema or exudate noted.   Neck:  No deformities, thyromegaly, masses, or tenderness noted.   Supple with full range of motion without pain.   Heart:  Normal rate and regular rhythm. S1 and S2 normal without gallop, murmur, click, rub or other extra sounds.   Lungs:Chest clear to auscultation; no wheezes, rhonchi,rales ,or rubs present.No increased work of breathing.    Extremities:  No cyanosis, edema, or clubbing  noted   Skin: Warm & dry w/o jaundice or tenting.        Assessment & Plan:

## 2014-02-12 NOTE — Progress Notes (Signed)
   Subjective:    Patient ID: Russell Gonzalez, male    DOB: Dec 18, 1956, 58 y.o.   MRN: [id number]  HPI The patient is here due to elevated BP readings at home; reports readings ranging from 123/80 to 150/95. Reports for last 3-4 weeks he has experienced headaches behind eyes, which he describes as pounding, lasting 2-15 minutes. He does not take anything for the HA.  He also describes a flushing/tingling sensation in his face. Reports  Diet is heart healthy.  Exercise running 12-15 miles/week and kettle ball weight training 3/week.  Blood pressure average 130/85-90  There is medical compliance with antihypertensive medications.    Review of Systems Constitutional: No significant weight changes, excess fatigue  Eyes: No blurred vision, double vision, loss of vision  Cardiovascular: denies chest pain, paroxysmal tachycardia or hypertension, irregular rhythm, syncope, claudication, edema  Respiratory: No exertional dyspnea, paroxysmal nocturnal dyspnea  Genitourinary: No dysuria,, hematuria, pyuria, frequency, incontinence, nocturia  GI: denies diarrhea, changes in BM.  Musculoskeletal: No myalgias or muscle cramping  Neurologic: No limb weakness, numbness or tingling, burning    Objective:   Physical Exam General appearance:good health ;well nourished; no acute distress or increased work of breathing is present. No lymphadenopathy about the head, neck, or axilla noted.  Eyes: No conjunctival inflammation or lid edema is present. There is no scleral icterus. EOM intact without pain. Field of vision normal. Fundal exam noted bilateral arterial narrowing.  Neck: No deformities, thyromegaly, masses, or tenderness noted. Supple with full range of motion without pain.  Heart: Normal rate and regular rhythm. S1 and S2 normal without gallop, murmur, click, rub or other extra sounds.  Lungs:Chest clear to auscultation; no wheezes, rhonchi,rales ,or rubs present.No increased work of breathing.    Extremities: No cyanosis, edema, or clubbing noted  GI: No organomegaly, masses noted. No AAA.  Skin: Warm & dry w/o jaundice or tenting. Neuro: Cranial nerve exam normal including facial sensation        Assessment & Plan:  # 1 hypertension #2 headache #3 flushing See orders

## 2014-02-25 ENCOUNTER — Encounter: Payer: Self-pay | Admitting: *Deleted

## 2014-09-13 ENCOUNTER — Encounter: Payer: Self-pay | Admitting: Gastroenterology

## 2015-02-03 ENCOUNTER — Other Ambulatory Visit: Payer: Self-pay | Admitting: Internal Medicine

## 2015-03-23 ENCOUNTER — Other Ambulatory Visit (INDEPENDENT_AMBULATORY_CARE_PROVIDER_SITE_OTHER): Payer: PRIVATE HEALTH INSURANCE

## 2015-03-23 ENCOUNTER — Ambulatory Visit (INDEPENDENT_AMBULATORY_CARE_PROVIDER_SITE_OTHER): Payer: PRIVATE HEALTH INSURANCE | Admitting: Internal Medicine

## 2015-03-23 ENCOUNTER — Encounter: Payer: Self-pay | Admitting: Internal Medicine

## 2015-03-23 VITALS — BP 140/100 | HR 66 | Temp 98.1°F | Resp 15 | Ht 72.0 in | Wt 206.5 lb

## 2015-03-23 DIAGNOSIS — Z0189 Encounter for other specified special examinations: Secondary | ICD-10-CM

## 2015-03-23 DIAGNOSIS — H00033 Abscess of eyelid right eye, unspecified eyelid: Secondary | ICD-10-CM

## 2015-03-23 DIAGNOSIS — Z0000 Encounter for general adult medical examination without abnormal findings: Secondary | ICD-10-CM

## 2015-03-23 LAB — CBC WITH DIFFERENTIAL/PLATELET
BASOS PCT: 0.8 % (ref 0.0–3.0)
Basophils Absolute: 0.1 10*3/uL (ref 0.0–0.1)
EOS PCT: 3.4 % (ref 0.0–5.0)
Eosinophils Absolute: 0.2 10*3/uL (ref 0.0–0.7)
HEMATOCRIT: 43.5 % (ref 39.0–52.0)
Hemoglobin: 14.8 g/dL (ref 13.0–17.0)
LYMPHS ABS: 1.9 10*3/uL (ref 0.7–4.0)
Lymphocytes Relative: 31.1 % (ref 12.0–46.0)
MCHC: 34 g/dL (ref 30.0–36.0)
MCV: 88.1 fl (ref 78.0–100.0)
Monocytes Absolute: 0.7 10*3/uL (ref 0.1–1.0)
Monocytes Relative: 10.7 % (ref 3.0–12.0)
NEUTROS PCT: 54 % (ref 43.0–77.0)
Neutro Abs: 3.4 10*3/uL (ref 1.4–7.7)
Platelets: 325 10*3/uL (ref 150.0–400.0)
RBC: 4.94 Mil/uL (ref 4.22–5.81)
RDW: 12.8 % (ref 11.5–15.5)
WBC: 6.2 10*3/uL (ref 4.0–10.5)

## 2015-03-23 LAB — BASIC METABOLIC PANEL
BUN: 15 mg/dL (ref 6–23)
CO2: 29 mEq/L (ref 19–32)
Calcium: 9.5 mg/dL (ref 8.4–10.5)
Chloride: 104 mEq/L (ref 96–112)
Creatinine, Ser: 0.91 mg/dL (ref 0.40–1.50)
GFR: 90.35 mL/min (ref >60.00)
GLUCOSE: 88 mg/dL (ref 70–99)
POTASSIUM: 4.5 meq/L (ref 3.5–5.1)
Sodium: 138 mEq/L (ref 135–145)

## 2015-03-23 LAB — HEPATIC FUNCTION PANEL
ALT: 28 U/L (ref 0–53)
AST: 19 U/L (ref 0–37)
Albumin: 4.2 g/dL (ref 3.5–5.2)
Alkaline Phosphatase: 89 U/L (ref 39–117)
BILIRUBIN DIRECT: 0.1 mg/dL (ref 0.0–0.3)
BILIRUBIN TOTAL: 0.7 mg/dL (ref 0.2–1.2)
TOTAL PROTEIN: 7.5 g/dL (ref 6.0–8.3)

## 2015-03-23 LAB — LIPID PANEL
Cholesterol: 216 mg/dL — ABNORMAL HIGH (ref 0–200)
HDL: 54.2 mg/dL (ref >39.00)
LDL Cholesterol: 137 mg/dL — ABNORMAL HIGH (ref 0–99)
NonHDL: 161.8
TRIGLYCERIDES: 123 mg/dL (ref 0.0–149.0)
Total CHOL/HDL Ratio: 4
VLDL: 24.6 mg/dL (ref 0.0–40.0)

## 2015-03-23 LAB — TSH: TSH: 1.27 u[IU]/mL (ref 0.35–4.50)

## 2015-03-23 MED ORDER — ERYTHROMYCIN 5 MG/GM OP OINT: 1.0000 "application " | TOPICAL_OINTMENT | Freq: Four times a day (QID) | OPHTHALMIC | Status: AC

## 2015-03-23 NOTE — Progress Notes (Signed)
Subjective:    Patient ID: Russell Gonzalez, male    DOB: 07/31/1956, 59 y.o.   MRN: [id number]  HPI  He is here for a physical;acute issues are delineated below.  He was in [place] in March. When he came back he developed a flulike illness with malaise which lasted 2 weeks. This was associated with nausea and vomiting. He was essentially bedridden for 5 days. He had nasal sores as well as rash around his neck. He's had frequent loose stools which are improving. He has not treated this  illness with any specific medication. He did not take the flu shot  He has subsequently developed swelling and redness of the right upper eyelid. He's had some matting of the eye in the mornings. He denies blurred vision, double vision, or loss of vision.  Because of this illness he stopped his diltiazem, HCTZ, pravastatin, and Nexium.  His blood pressure has ranged 127/85-135/93 off the blood pressure medicines. He is on a heart healthy ,low-salt diet. He exercises using kettle bells & treadmill at least 60 minutes 3-4 times a week without cardiopulmonary symptoms.     Review of Systems  Chest pain, palpitations, tachycardia, exertional dyspnea, paroxysmal nocturnal dyspnea, claudication or edema are absent.  Significant unexplained weight loss, abdominal pain, significant dyspepsia, dysphagia, melena, rectal bleeding, or persistently small caliber stools are denied.     Objective:   Physical Exam  Gen.: Adequately nourished in appearance. Alert, appropriate and cooperative throughout exam. BMI:28 Appears younger than stated age  Head: Normocephalic without obvious abnormalities  Eyes: Cellulitis right upper lid. Pupils equal round reactive to light and accommodation. Extraocular motion intact.  Ears: External  ear exam reveals no significant lesions or deformities. Canals clear .TMs normal. Hearing is grossly normal bilaterally. Nose: External nasal exam reveals no deformity or inflammation. Nasal  mucosa are pink and moist. No lesions or exudates noted.   Mouth: Oral mucosa and oropharynx reveal no lesions or exudates. Teeth in good repair. Neck: No deformities, masses, or tenderness noted. Range of motion & Thyroid normal. Lungs: Normal respiratory effort; chest expands symmetrically. Lungs are clear to auscultation without rales, wheezes, or increased work of breathing. Heart: Normal rate and rhythm. Normal S1 and S2. No gallop, click, or rub. S4 with slight slurring. Abdomen: Bowel sounds normal; abdomen soft and nontender. No masses, organomegaly or hernias noted. Genitalia: Genitalia normal except for left varices. Prostate is normal without enlargement, asymmetry, nodularity, or induration. Stool is light yellow/ brown and loose.                       Musculoskeletal/extremities: No deformity or scoliosis noted of  the thoracic or lumbar spine.  No clubbing, cyanosis, edema, or significant extremity  deformity noted.  Range of motion normal . Tone & strength normal. Hand joints normal Fingernail  health good. Able to lie down & sit up w/o help.  Negative SLR bilaterally Vascular: Carotid, radial artery, dorsalis pedis and  posterior tibial pulses are full and equal. No bruits present. Neurologic: Alert and oriented x3. Deep tendon reflexes symmetrical and normal.  Gait normal     Skin: Resolving papular dermatitis right lower neck greater than left. No vesicles or pustules present. Lymph: No cervical, axillary, or inguinal lymphadenopathy present. Psych: Mood and affect are normal. Normally interactive  Assessment & Plan:  #1 comprehensive physical exam; no acute findings.  #2 status post viral illness with gastroneuritis.  #3 active cellulitis right eye  Plan: see Orders  & Recommendations

## 2015-03-23 NOTE — Progress Notes (Signed)
Pre visit review using our clinic review tool, if applicable. No additional management support is needed unless otherwise documented below in the visit note. 

## 2015-03-23 NOTE — Patient Instructions (Signed)
Your next office appointment will be determined based upon review of your pending labs. Those instructions will be transmitted to you by My Chart Critical results will be called.  Followup as needed for any active or acute issue. Please report any significant change in your symptoms.  Minimal Blood Pressure Goal= AVERAGE < 140/90;  Ideal is an AVERAGE < 135/85. This AVERAGE should be calculated from @ least 5-7 BP readings taken @ different times of day on different days of week. You should not respond to isolated BP readings , but rather the AVERAGE for that week .Please bring your  blood pressure cuff to office visits to verify that it is reliable.It  can also be checked against the blood pressure device at the pharmacy. Finger or wrist cuffs are not dependable; an arm cuff is.  External eye cleansing in the shower as discussed with lather of mild shampoo.After 10 seconds wash off lather . Make sure that all residual soap is removed to prevent irritation.   Please take a probiotic , Florastor OR Align, every day if the bowels are loose. This will replace the normal bacteria which  are necessary for formation of normal stool and processing of food.

## 2015-03-24 ENCOUNTER — Telehealth: Payer: Self-pay

## 2015-03-24 ENCOUNTER — Other Ambulatory Visit: Payer: Self-pay | Admitting: Internal Medicine

## 2015-03-24 DIAGNOSIS — E785 Hyperlipidemia, unspecified: Secondary | ICD-10-CM

## 2015-03-24 NOTE — Telephone Encounter (Signed)
Left message for pt to call back so lab results can be reviewed, advised pt to ask for tamara/RN

## 2015-05-26 ENCOUNTER — Other Ambulatory Visit: Payer: Self-pay | Admitting: Internal Medicine

## 2015-10-15 ENCOUNTER — Other Ambulatory Visit: Payer: Self-pay

## 2017-12-19 ENCOUNTER — Encounter: Payer: Self-pay | Admitting: Gastroenterology

## 2018-08-30 ENCOUNTER — Other Ambulatory Visit: Payer: Self-pay | Admitting: Internal Medicine

## 2018-08-30 DIAGNOSIS — E041 Nontoxic single thyroid nodule: Secondary | ICD-10-CM

## 2018-09-07 ENCOUNTER — Ambulatory Visit
Admission: RE | Admit: 2018-09-07 | Discharge: 2018-09-07 | Disposition: A | Discharge status: Home / Self Care | Payer: PRIVATE HEALTH INSURANCE | Source: Ambulatory Visit | Attending: Internal Medicine | Admitting: Internal Medicine

## 2018-09-07 DIAGNOSIS — E041 Nontoxic single thyroid nodule: Secondary | ICD-10-CM

## 2018-09-25 ENCOUNTER — Other Ambulatory Visit: Payer: Self-pay | Admitting: Internal Medicine

## 2018-09-25 DIAGNOSIS — Z808 Family history of malignant neoplasm of other organs or systems: Secondary | ICD-10-CM

## 2018-09-25 DIAGNOSIS — E042 Nontoxic multinodular goiter: Secondary | ICD-10-CM

## 2018-11-28 ENCOUNTER — Ambulatory Visit
Admission: RE | Admit: 2018-11-28 | Discharge: 2018-11-28 | Disposition: A | Discharge status: Home / Self Care | Payer: 59 | Source: Ambulatory Visit | Attending: Internal Medicine | Admitting: Internal Medicine

## 2018-11-28 DIAGNOSIS — E042 Nontoxic multinodular goiter: Secondary | ICD-10-CM

## 2018-11-28 DIAGNOSIS — Z808 Family history of malignant neoplasm of other organs or systems: Secondary | ICD-10-CM

## 2018-12-05 ENCOUNTER — Other Ambulatory Visit: Payer: Self-pay | Admitting: Internal Medicine

## 2018-12-05 DIAGNOSIS — E042 Nontoxic multinodular goiter: Secondary | ICD-10-CM

## 2018-12-05 DIAGNOSIS — Z808 Family history of malignant neoplasm of other organs or systems: Secondary | ICD-10-CM

## 2018-12-26 ENCOUNTER — Other Ambulatory Visit (HOSPITAL_COMMUNITY)
Admission: RE | Admit: 2018-12-26 | Discharge: 2018-12-26 | Disposition: A | Discharge status: Home / Self Care | Payer: 59 | Source: Ambulatory Visit | Attending: Radiology | Admitting: Radiology

## 2018-12-26 ENCOUNTER — Ambulatory Visit
Admission: RE | Admit: 2018-12-26 | Discharge: 2018-12-26 | Disposition: A | Discharge status: Home / Self Care | Payer: 59 | Source: Ambulatory Visit | Attending: Internal Medicine | Admitting: Internal Medicine

## 2018-12-26 DIAGNOSIS — Z808 Family history of malignant neoplasm of other organs or systems: Secondary | ICD-10-CM

## 2018-12-26 DIAGNOSIS — E042 Nontoxic multinodular goiter: Secondary | ICD-10-CM | POA: Diagnosis present

## 2019-01-04 ENCOUNTER — Other Ambulatory Visit: Payer: Self-pay | Admitting: Internal Medicine

## 2019-01-04 DIAGNOSIS — Z808 Family history of malignant neoplasm of other organs or systems: Secondary | ICD-10-CM

## 2019-01-04 DIAGNOSIS — E042 Nontoxic multinodular goiter: Secondary | ICD-10-CM

## 2020-01-16 ENCOUNTER — Other Ambulatory Visit: Payer: Self-pay | Admitting: Internal Medicine

## 2020-01-16 ENCOUNTER — Ambulatory Visit
Admission: RE | Admit: 2020-01-16 | Discharge: 2020-01-16 | Disposition: A | Discharge status: Home / Self Care | Payer: 59 | Source: Ambulatory Visit | Attending: Internal Medicine | Admitting: Internal Medicine

## 2020-01-16 DIAGNOSIS — E041 Nontoxic single thyroid nodule: Secondary | ICD-10-CM

## 2020-01-22 ENCOUNTER — Other Ambulatory Visit: Payer: Self-pay | Admitting: Internal Medicine

## 2020-01-22 DIAGNOSIS — E041 Nontoxic single thyroid nodule: Secondary | ICD-10-CM

## 2020-01-23 ENCOUNTER — Ambulatory Visit
Admission: RE | Admit: 2020-01-23 | Discharge: 2020-01-23 | Disposition: A | Discharge status: Home / Self Care | Payer: 59 | Source: Ambulatory Visit | Attending: Internal Medicine | Admitting: Internal Medicine

## 2020-01-23 ENCOUNTER — Other Ambulatory Visit (HOSPITAL_COMMUNITY)
Admission: RE | Admit: 2020-01-23 | Discharge: 2020-01-23 | Disposition: A | Discharge status: Home / Self Care | Payer: 59 | Source: Ambulatory Visit | Attending: Internal Medicine | Admitting: Internal Medicine

## 2020-01-23 DIAGNOSIS — E041 Nontoxic single thyroid nodule: Secondary | ICD-10-CM | POA: Diagnosis present

## 2020-01-23 NOTE — Procedures (Signed)
PROCEDURE SUMMARY:  Using direct ultrasound guidance, 5 passes were made using 25 g needles into the nodule within the right lobe of the thyroid.   Ultrasound was used to confirm needle placements on all occasions.   EBL = trace  Specimens were sent to Pathology for analysis.  See procedure note under Imaging tab in Epic for full procedure details.  Tenley Winward S Inri Sobieski PA-C 01/23/2020 11:28 AM

## 2020-01-24 LAB — CYTOLOGY - NON PAP

## 2020-02-19 ENCOUNTER — Ambulatory Visit: Payer: 59 | Admitting: Endocrinology

## 2020-07-17 IMAGING — US US FNA BIOPSY THYROID 1ST LESION
1 series · 13 of 19 positions shown · non-contrast
Comparison: US thyroid 11/28/18 ad 09/07/18

INDICATION: Indeterminate thyroid nodule

Isthmus nodule 3.1 cm
EXAM:
ULTRASOUND GUIDED FINE NEEDLE ASPIRATION OF INDETERMINATE THYROID
NODULE
TECHNIQUE: Informed written consent was obtained from the patient after a
discussion of the risks, benefits and alternatives to treatment.
Questions regarding the procedure were encouraged and answered. A
timeout was performed prior to the initiation of the procedure.

[Series 1: us fna biopsy thyroid 1st lesion · 0.06mm/px · 19 acquisitions, 13 frames shown]
[im 1/19]
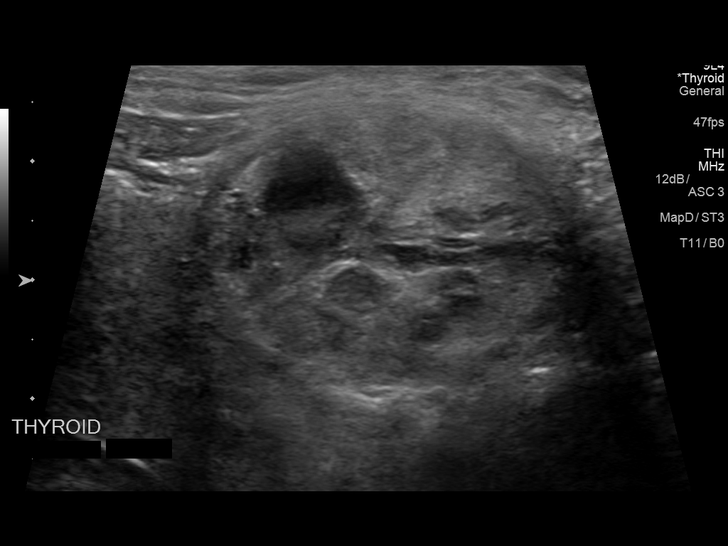
[im 3/19]
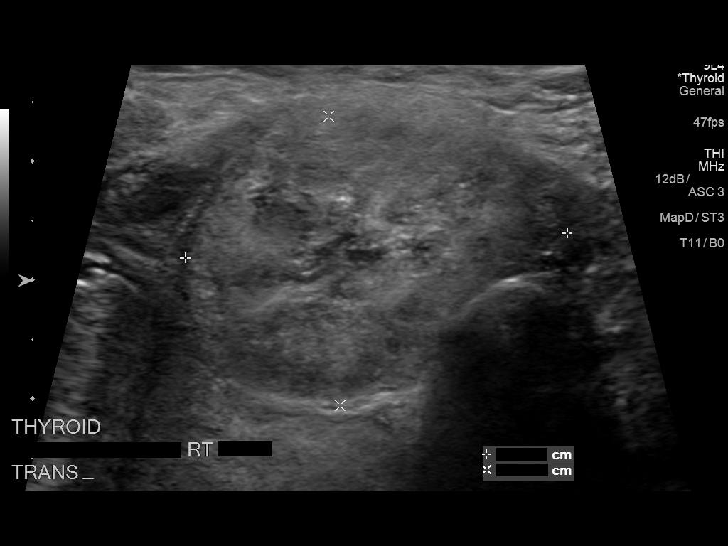
[im 4/19]
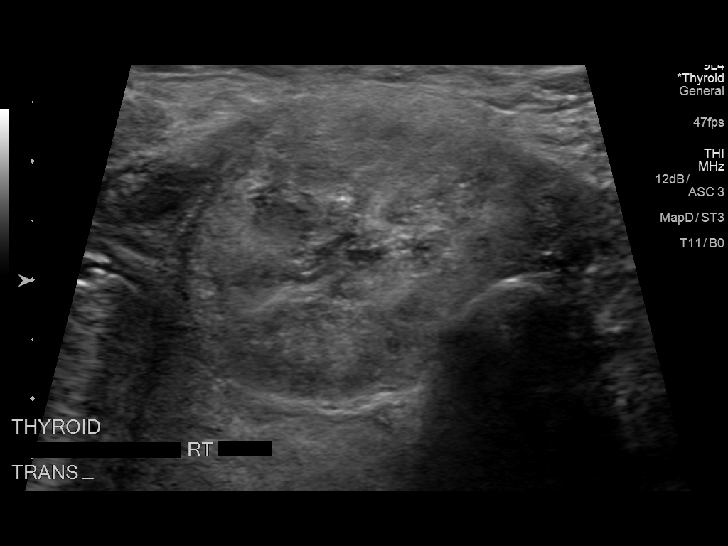
[im 6/19]
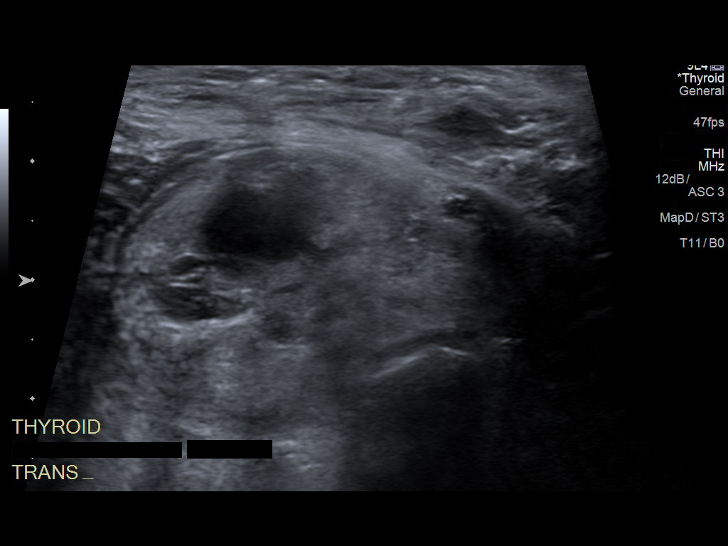
[im 7/19]
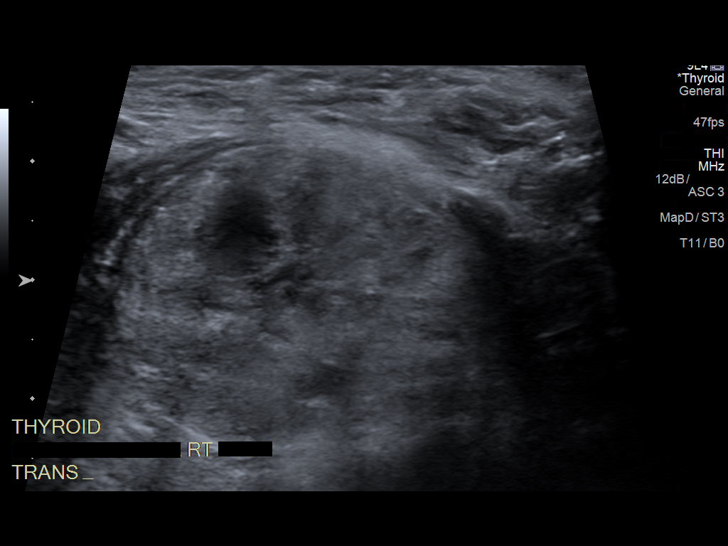
[im 9/19]
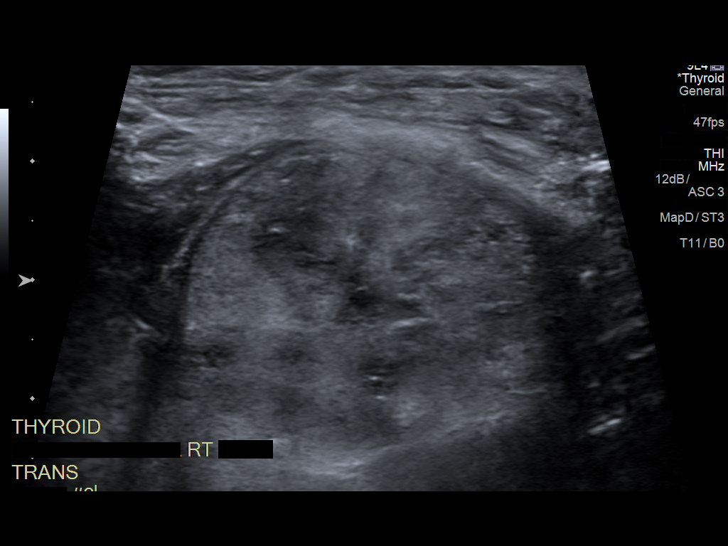
[im 10/19]
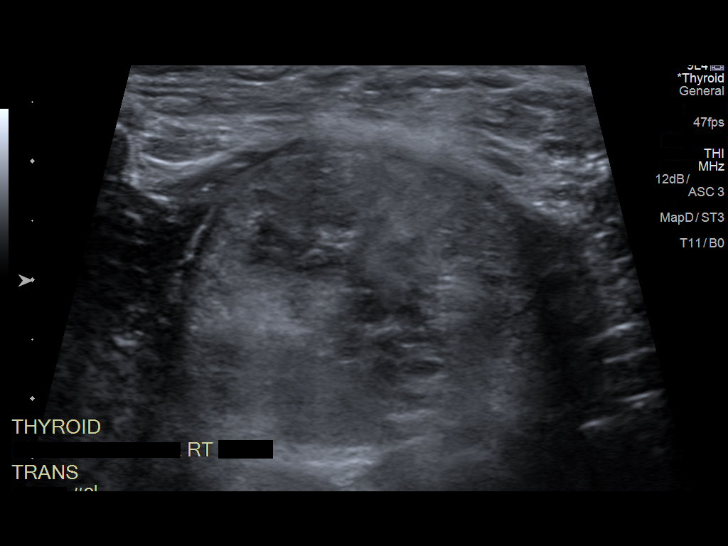
[im 11/19]
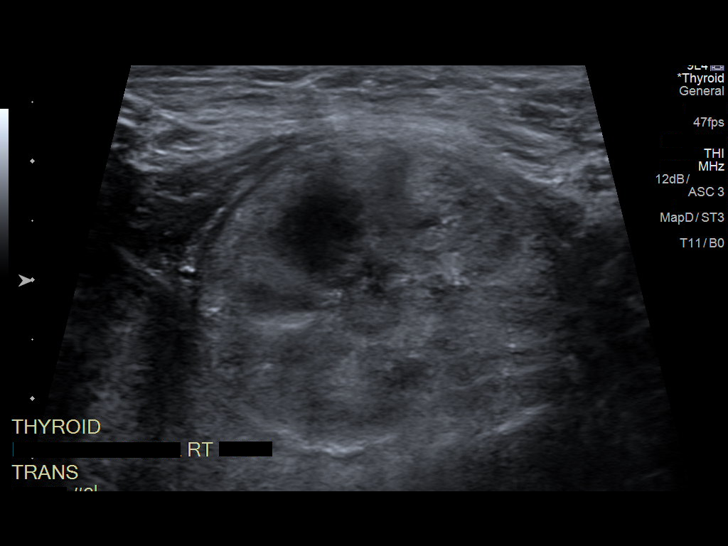
[im 13/19]
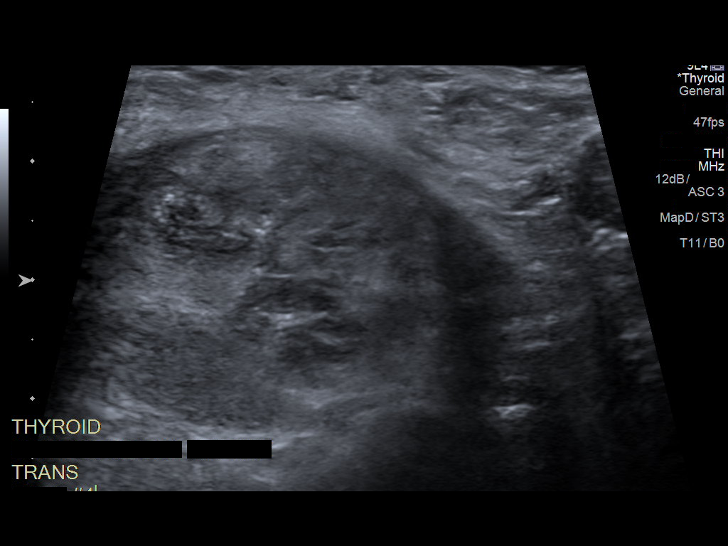
[im 14/19]
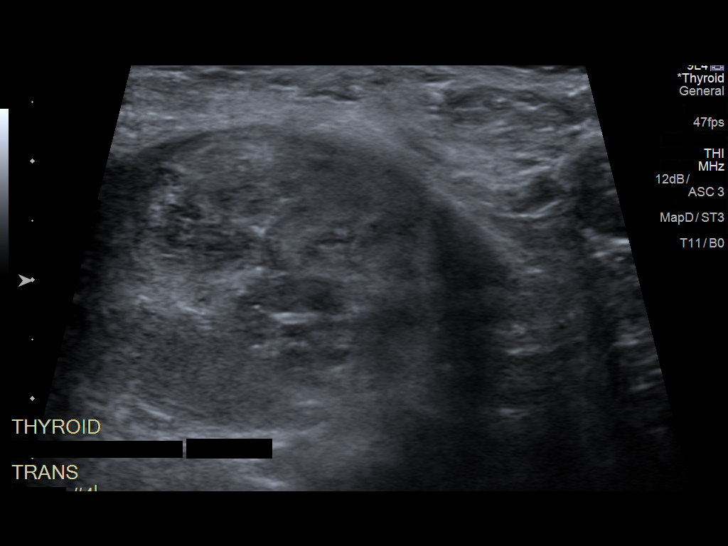
[im 16/19]
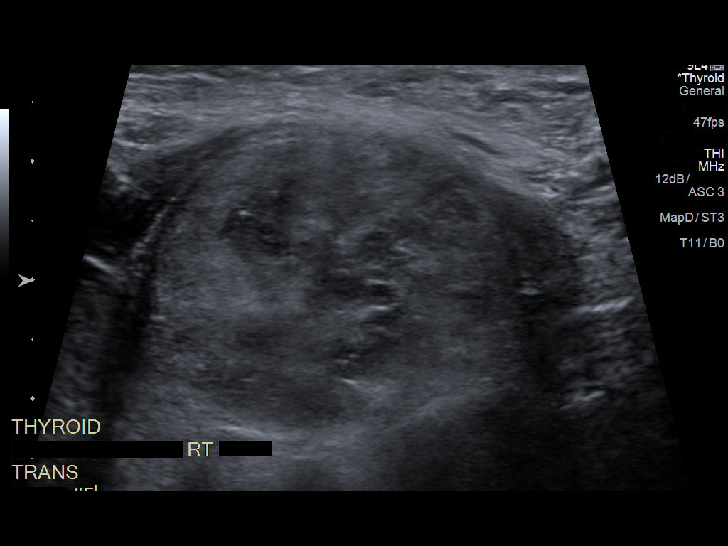
[im 17/19]
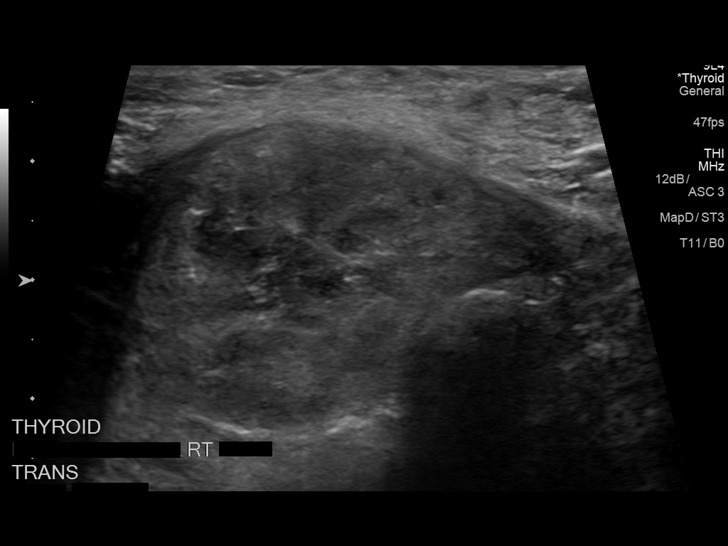
[im 19/19]
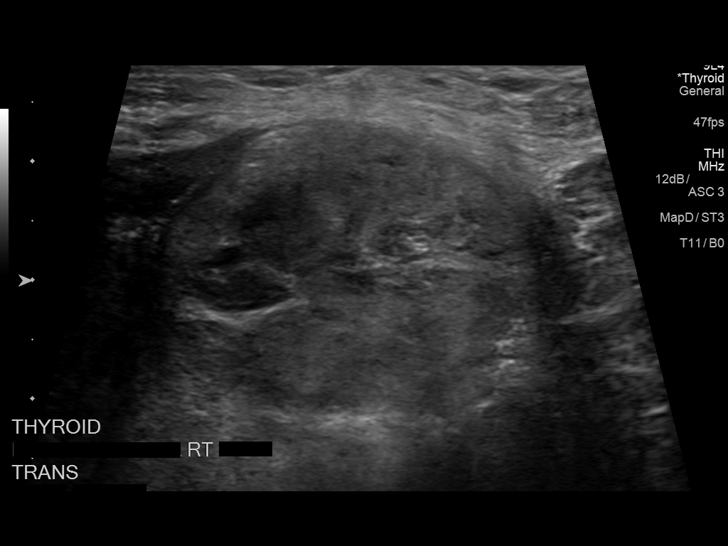

[13 of 19 positions shown; findings below may reference images not displayed]

Previous biopsy [DATE]-- Salome/Phelithemba per pt

No result available--- pt states "benign"

MEDICATIONS:
5 cc 1% lidocaine

COMPLICATIONS:
None immediate.
Pre-procedural ultrasound scanning demonstrated unchanged size and
appearance of the indeterminate nodule within the Isthmus

The procedure was planned. The neck was prepped in the usual sterile
fashion, and a sterile drape was applied covering the operative
field. A timeout was performed prior to the initiation of the
procedure. Local anesthesia was provided with 1% lidocaine.

Under direct ultrasound guidance, 5 FNA biopsies were performed of
the Isthmus nodule with a 25 gauge needle.

2 of these samples were obtained for AFIRMA per ordering CHAMPUTIS

Multiple ultrasound images were saved for procedural documentation
purposes. The samples were prepared and submitted to pathology.

Limited post procedural scanning was negative for hematoma or
additional complication. Dressings were placed. The patient
tolerated the above procedures procedure well without immediate
postprocedural complication.
FINDINGS: Nodule reference number based on prior diagnostic ultrasound: 1

Maximum size: 3.1 cm

Location: Isthmus; Mid

ACR TI-RADS risk category: TR3 (3 points)

Reason for biopsy: meets ACR TI-RADS criteria

Ultrasound imaging confirms appropriate placement of the needles
within the thyroid nodule.
IMPRESSION: Technically successful ultrasound guided fine needle aspiration of
Isthmus nodule

Read by

Asdesach Ach Appsman

## 2020-10-15 IMAGING — US US THYROID
1 series · 13 of 25 positions shown · non-contrast
Comparison: 09/07/2018, 11/28/2018, biopsy 12/26/2018

CLINICAL DATA: 63-year-old male with a history of thyroid goiter

EXAM:
THYROID ULTRASOUND
TECHNIQUE: Ultrasound examination of the thyroid gland and adjacent soft
tissues was performed.

[Series 1: us thyroid · 0.06mm/px · 13 of 45 slices shown]
[im 1/45]
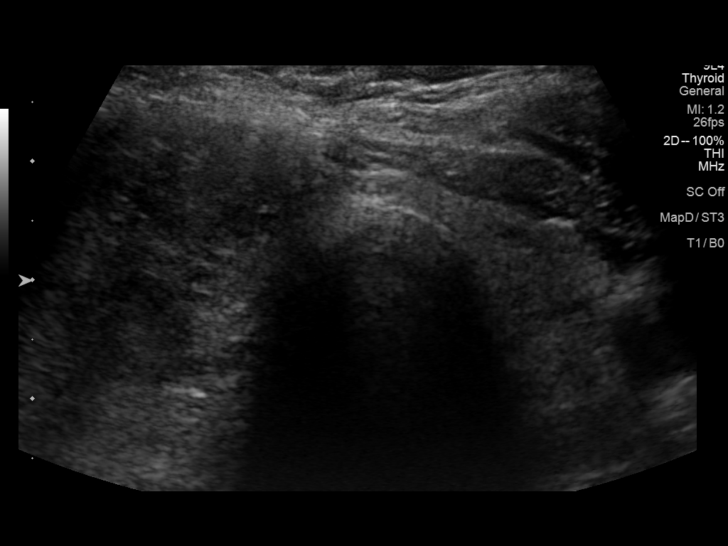
[im 4/45]
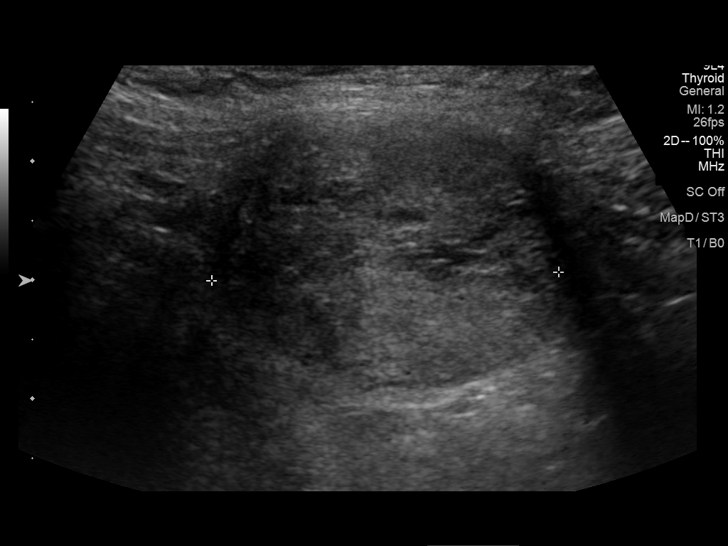
[im 8/45]
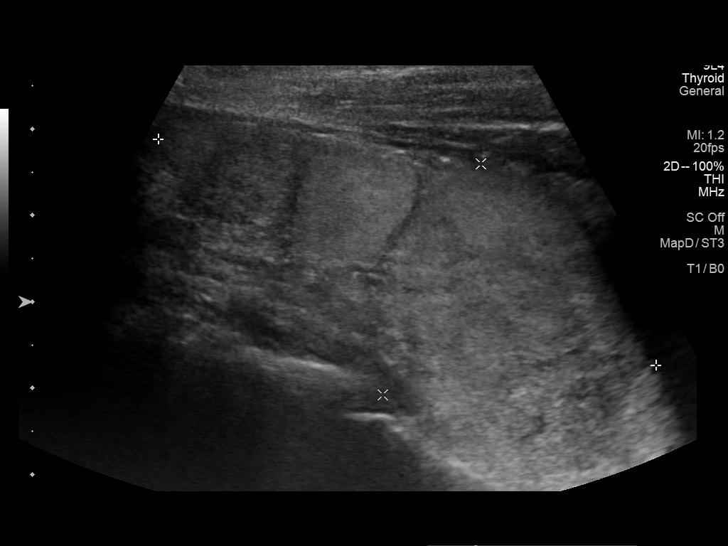
[im 12/45]
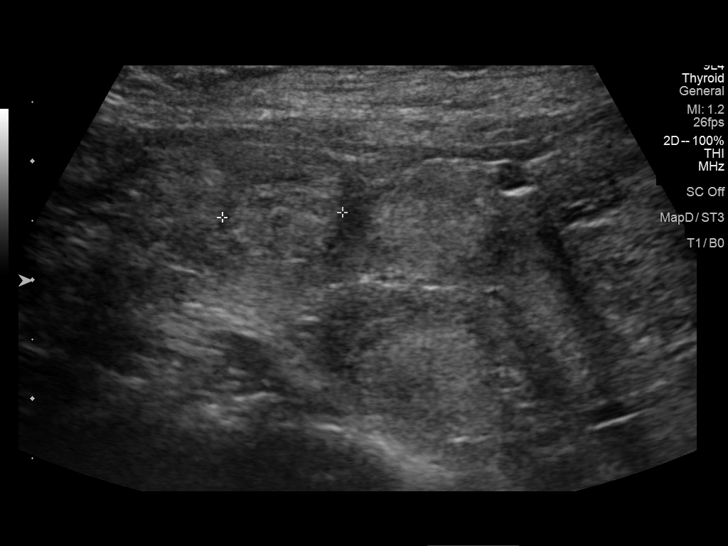
[im 15/45]
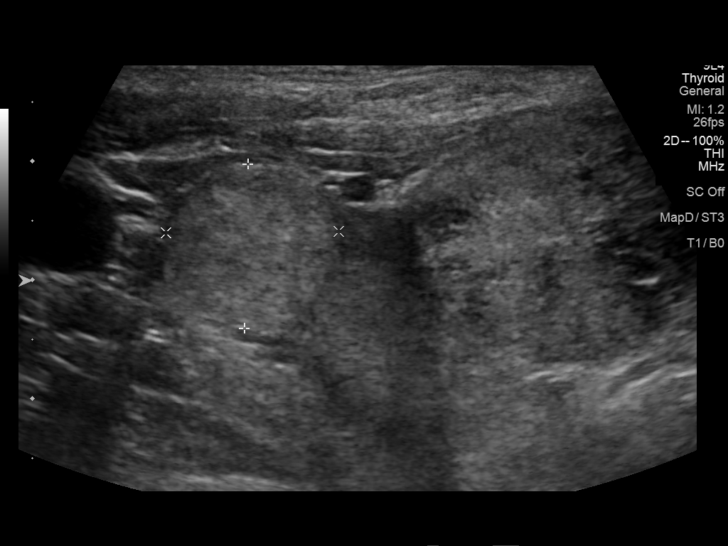
[im 19/45]
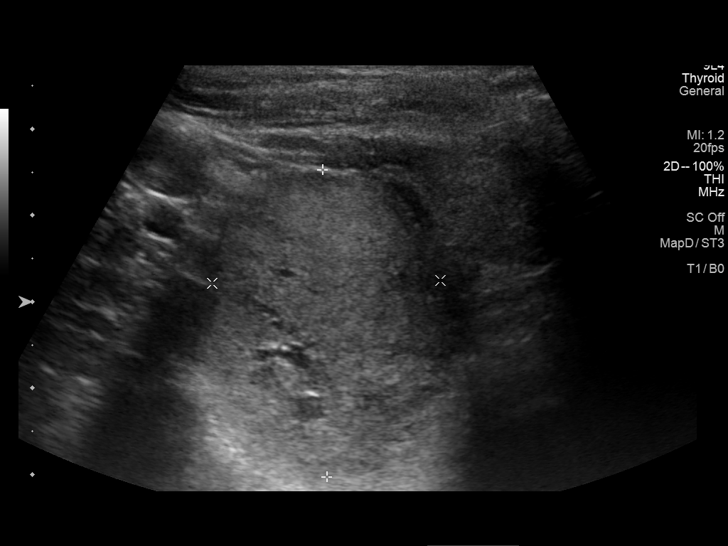
[im 23/45]
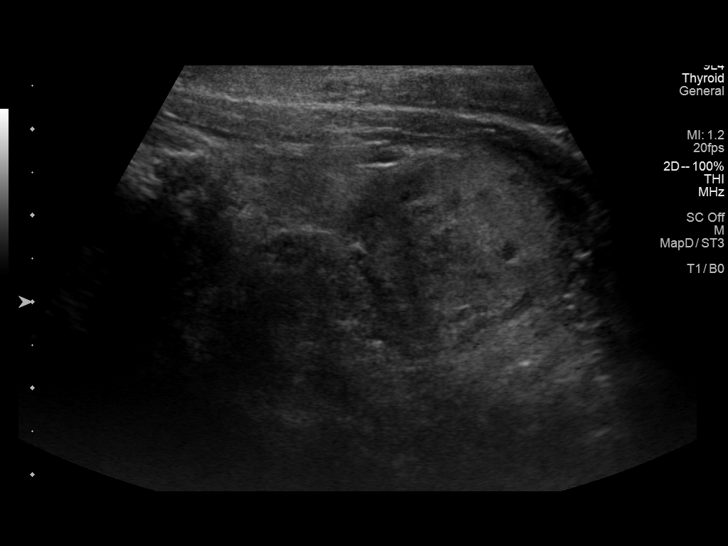
[im 26/45]
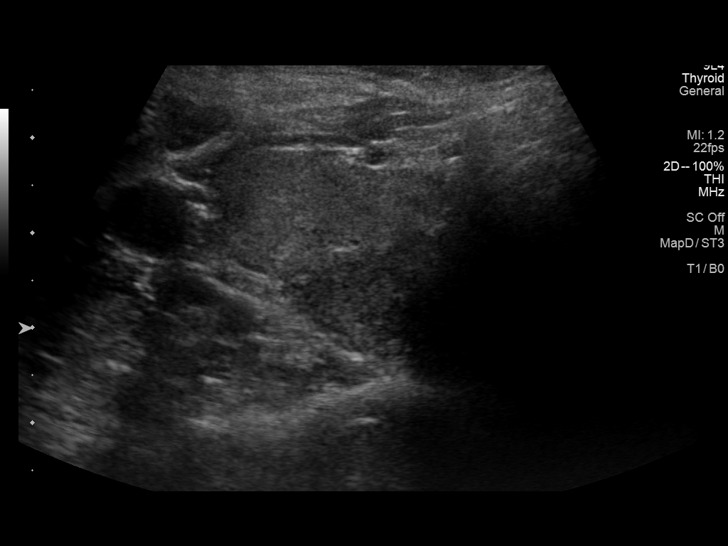
[im 30/45]
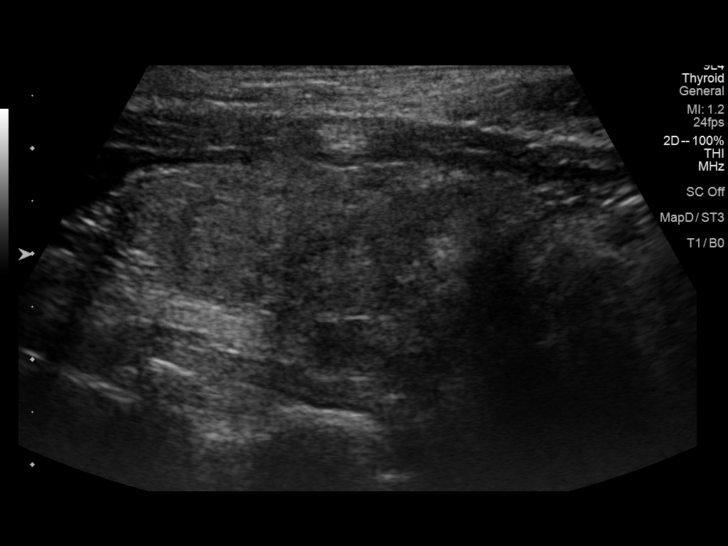
[im 34/45]
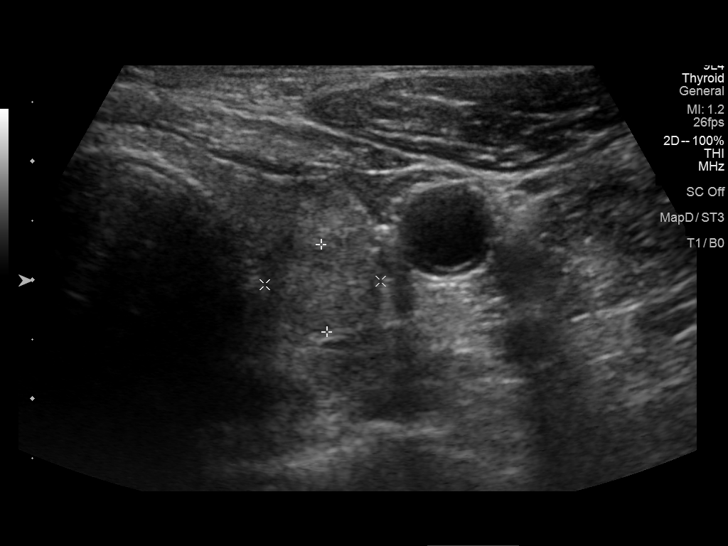
[im 37/45]
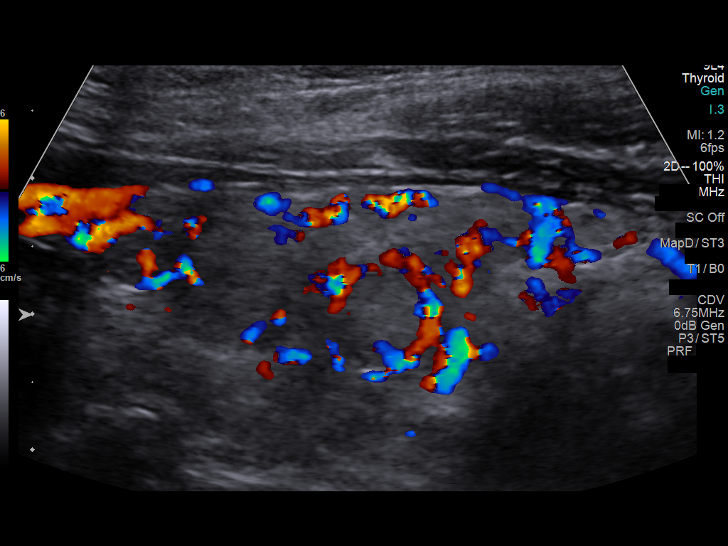
[im 41/45]
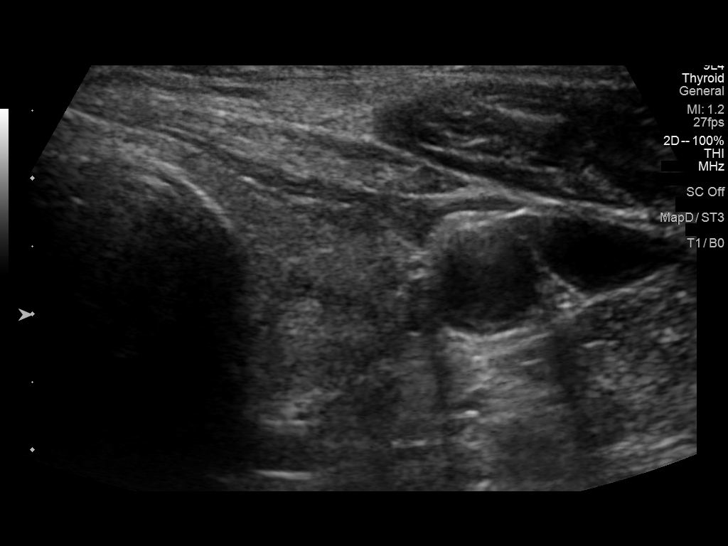
[im 45/45]
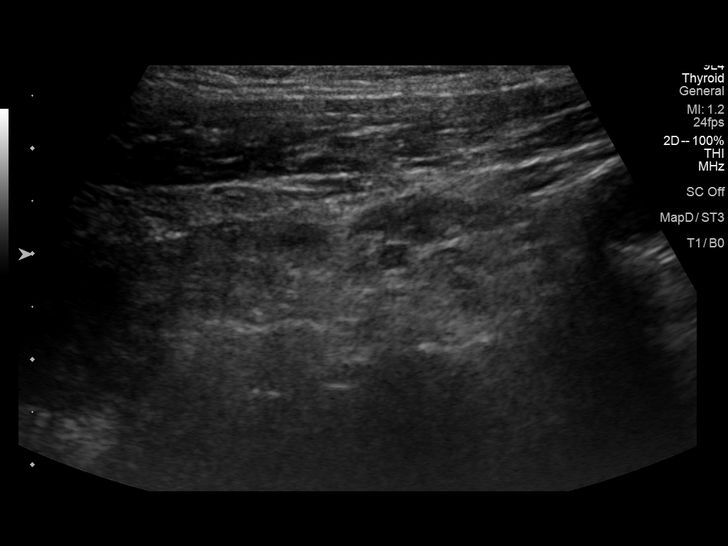

[13 of 25 positions shown; findings below may reference images not displayed]

FINDINGS: Parenchymal Echotexture: Markedly heterogenous

Isthmus: 0.5 cm

Right lobe: 6.3 cm x 2.9 cm x 2.5 cm

Left lobe: 4.5 cm x 2.0 cm x 1.2 cm

_________________________________________________________

Estimated total number of nodules >/= 1 cm: 6-10

Number of spongiform nodules >/=  2 cm not described below (TR1): 0

Number of mixed cystic and solid nodules >/= 1.5 cm not described
below (TR2): 0

_________________________________________________________

Nodule labeled 1 in the isthmus, unchanged, 3.1 cm. This nodule was
previously biopsied.

Nodule 2 in the superior right thyroid, smaller than previous now
1.4 cm. This remains TR 3, and given the size changed no longer
meets criteria for surveillance.

Nodule labeled 3 in the right mid thyroid, 1.46 cm. This remains TR
3, and is measuring smaller than previous, no longer meeting
criteria for surveillance.

Nodule # 4:

Location: Right; Inferior

Maximum size: 1.3 cm; Other 2 dimensions: 1.2 cm x 1.0 cm

Composition: solid/almost completely solid (2)

Echogenicity: isoechoic (1)

Shape: not taller-than-wide (0)

Margins: ill-defined (0)

Echogenic foci: none (0)

ACR TI-RADS total points: 3.

ACR TI-RADS risk category: TR3 (3 points).

ACR TI-RADS recommendations:

Nodule does not meet criteria for surveillance or biopsy

_________________________________________________________

Nodule # 5:

Location: Right; Inferior

Maximum size: 3.6 cm; Other 2 dimensions: 2.9 cm x 2.6 cm

Composition: solid/almost completely solid (2)

Echogenicity: isoechoic (1)

Shape: taller-than-wide (3)

Margins: smooth (0)

Echogenic foci: none (0)

ACR TI-RADS total points: 4.

ACR TI-RADS risk category: TR4 (4-6 points).

ACR TI-RADS recommendations:

Nodule meets criteria for biopsy

Nodule labeled 6 in the mid left thyroid, slightly smaller than
previous 1.0 cm and does not meet criteria for surveillance or
biopsy.

_________________________________________________________

No adenopathy
IMPRESSION: Multinodular thyroid.

Right inferior thyroid nodule (labeled 5, 3.6 cm, TR 4) meets
criteria for biopsy, as designated by the newly established ACR
TI-RADS criteria, and referral for biopsy is recommended.

Recommendations follow those established by the new ACR TI-RADS
criteria ([HOSPITAL] 3224;[DATE]).

## 2020-10-22 IMAGING — US US FNA BIOPSY THYROID 1ST LESION
1 series · 13 of 16 positions shown · non-contrast
Comparison: Ultrasound done January 16, 2020

MEDICATIONS:
1% lidocaine 4 mL

COMPLICATIONS:
None immediate.

INDICATION: Indeterminate thyroid nodule

EXAM:
ULTRASOUND GUIDED FINE NEEDLE ASPIRATION OF INDETERMINATE THYROID
NODULE
TECHNIQUE: Informed written consent was obtained from the patient after a
discussion of the risks, benefits and alternatives to treatment.
Questions regarding the procedure were encouraged and answered. A
timeout was performed prior to the initiation of the procedure.

[Series 1: us fna biopsy thyroid 1st lesion · 0.10mm/px · 16 acquisitions, 13 frames shown]
[im 1/16]
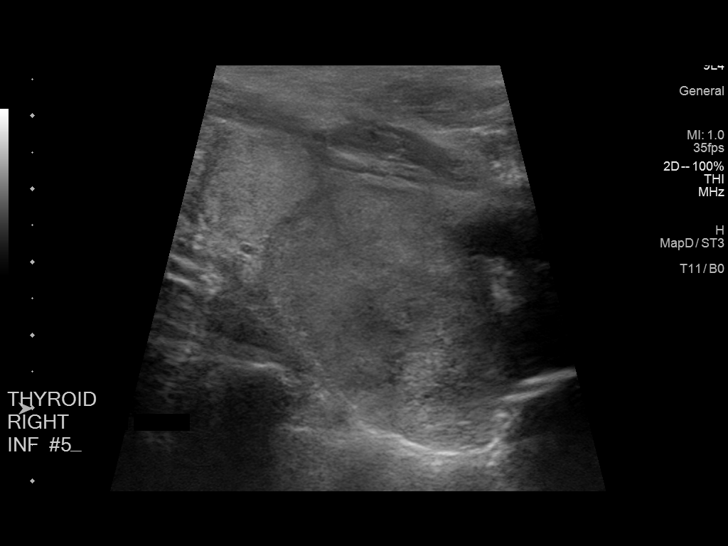
[im 2/16]
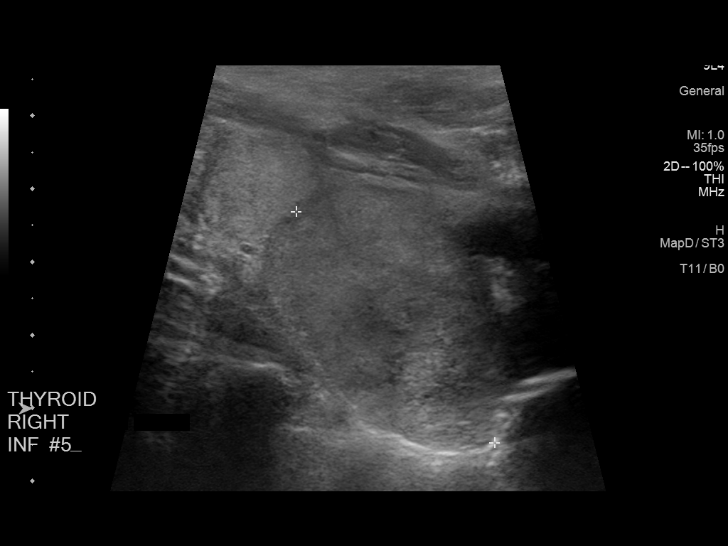
[im 4/16]
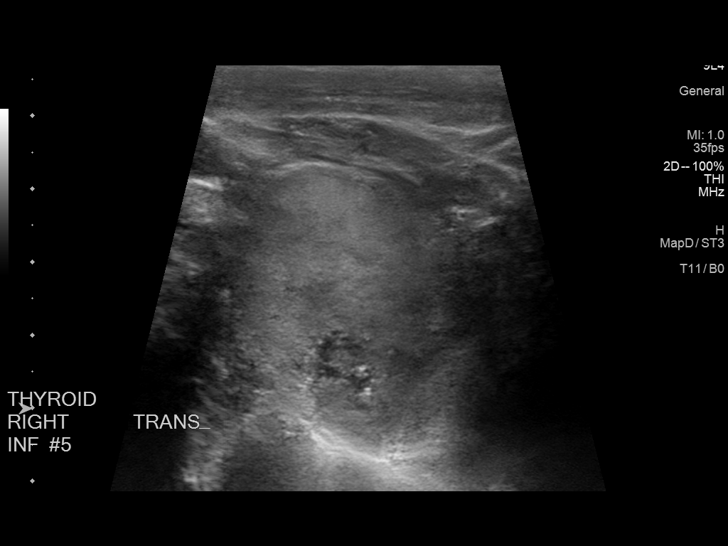
[im 5/16]
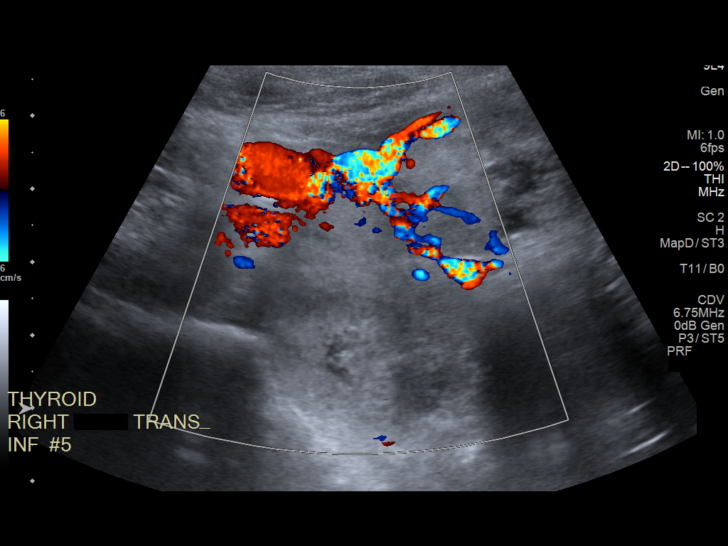
[im 6/16]
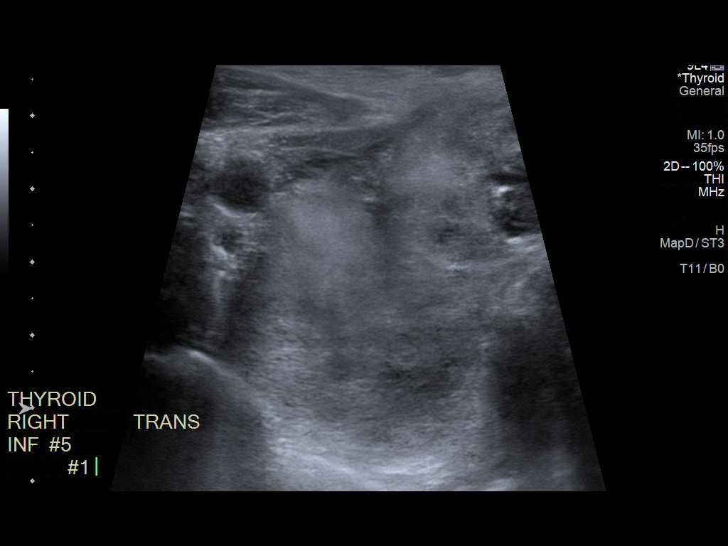
[im 7/16]
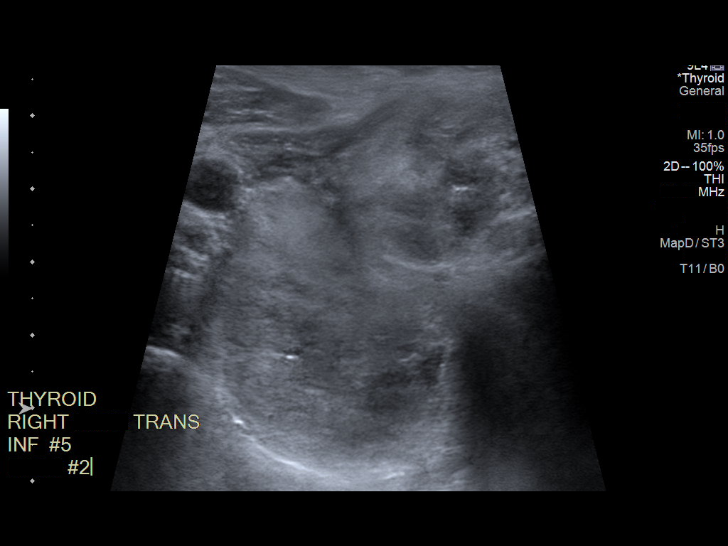
[im 9/16]
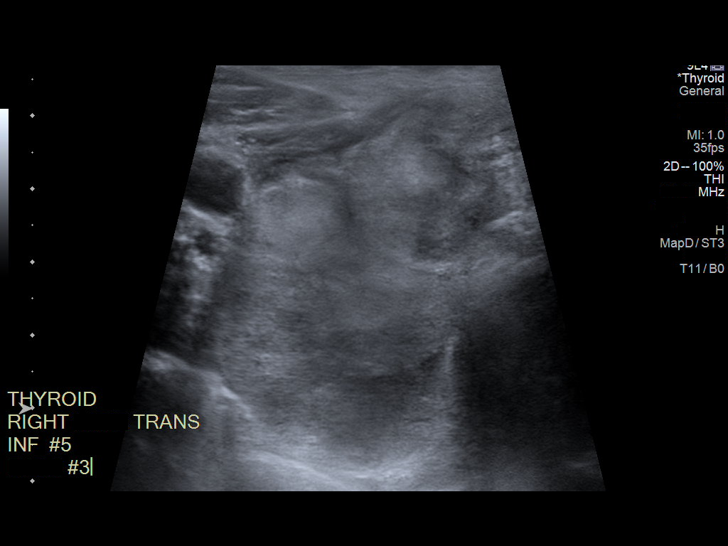
[im 10/16]
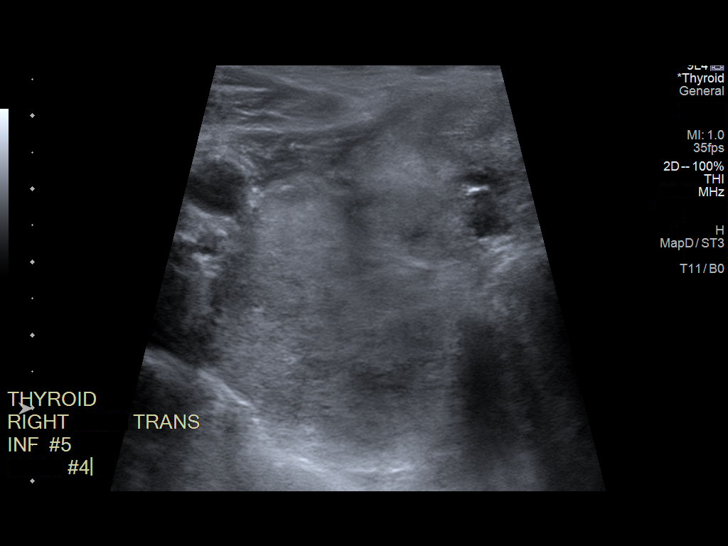
[im 11/16]
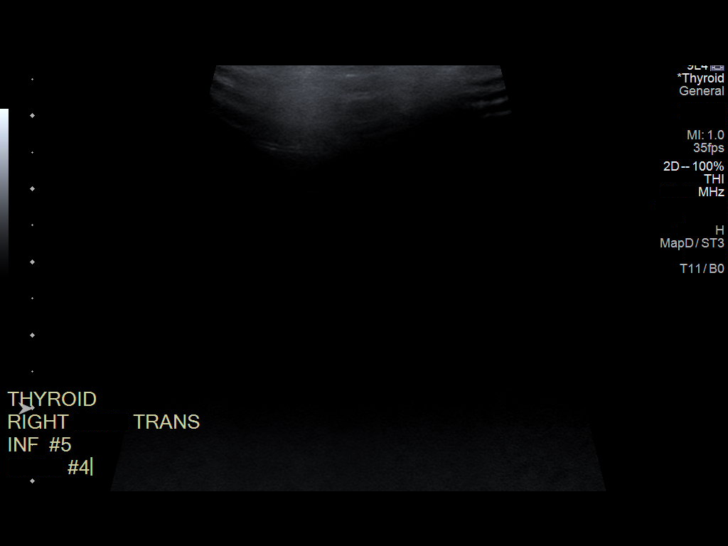
[im 12/16]
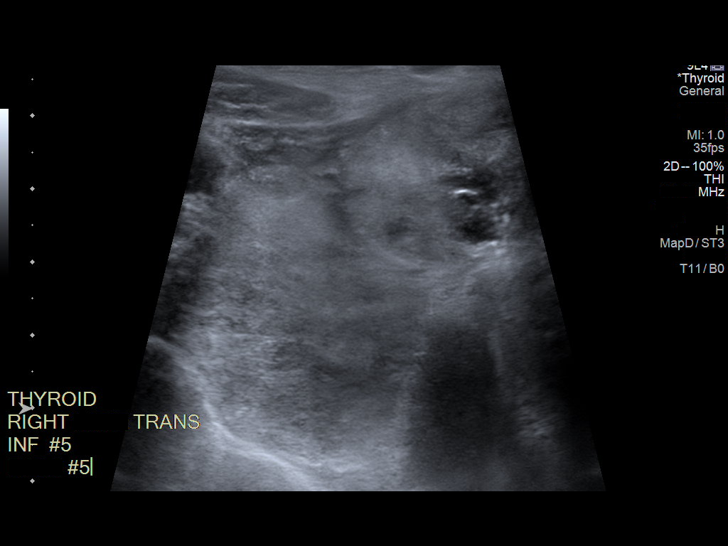
[im 13/16]
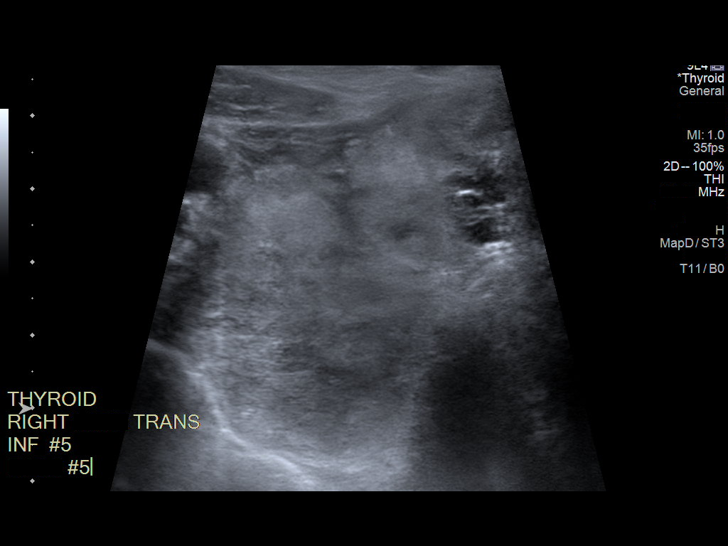
[im 15/16]
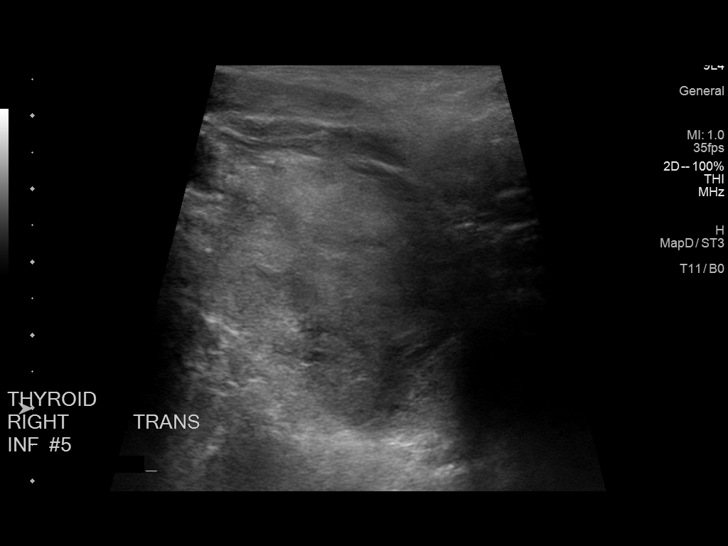
[im 16/16]
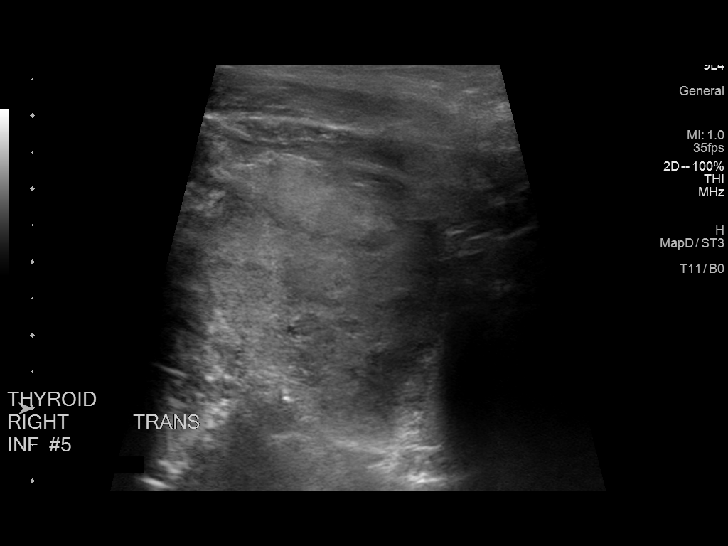

[13 of 16 positions shown; findings below may reference images not displayed]

Pre-procedural ultrasound scanning demonstrated unchanged size and
appearance of the indeterminate nodule within the right lobe of the
thyroid.

The procedure was planned. The neck was prepped in the usual sterile
fashion, and a sterile drape was applied covering the operative
field. A timeout was performed prior to the initiation of the
procedure. Local anesthesia was provided with 1% lidocaine.

Under direct ultrasound guidance, 5 FNA biopsies were performed of
the right inferior thyroid nodule with a 25 gauge needle. Multiple
ultrasound images were saved for procedural documentation purposes.
The samples were prepared and submitted to pathology.

Limited post procedural scanning was negative for hematoma or
additional complication. Dressings were placed. The patient
tolerated the above procedures procedure well without immediate
postprocedural complication.
FINDINGS: FINDINGS
Nodule reference number based on prior diagnostic ultrasound: 5

Maximum size: 3.6 cm

Location: Right  ;  Inferior

ACR TI-RADS risk category:  TR4

Reason for biopsy: meets ACR TI-RADS criteria

Ultrasound imaging confirms appropriate placement of the needles
within the thyroid nodule.
IMPRESSION: Technically successful ultrasound guided fine needle aspiration of
the right inferior thyroid nodule.

## 2023-08-29 ENCOUNTER — Ambulatory Visit (AMBULATORY_SURGERY_CENTER): Payer: BLUE CROSS/BLUE SHIELD | Admitting: *Deleted

## 2023-08-29 VITALS — Ht 72.0 in | Wt 200.0 lb

## 2023-08-29 DIAGNOSIS — Z86010 Personal history of colonic polyps: Secondary | ICD-10-CM

## 2023-08-29 MED ORDER — NA SULFATE-K SULFATE-MG SULF 17.5-3.13-1.6 GM/177ML PO SOLN: 1.0000 | Freq: Once | ORAL | 0 refills | Status: AC

## 2023-08-29 NOTE — Progress Notes (Signed)
Pre visit completed over telephone. Instructions forwarded through MyChart. All questions answered to patient's satisfaction.  No egg or soy allergy known to patient  No issues known to pt with past sedation with any surgeries or procedures Patient denies ever being told they had issues or difficulty with intubation  No FH of Malignant Hyperthermia Pt is not on diet pills Pt is not on  home 02  Pt is not on blood thinners  Pt denies issues with constipation  No A fib or A flutter Have any cardiac testing pending--NO Pt instructed to use Singlecare.com or GoodRx for a price reduction on prep

## 2023-09-14 ENCOUNTER — Encounter: Payer: Self-pay | Admitting: Gastroenterology

## 2023-09-14 ENCOUNTER — Ambulatory Visit: Payer: Medicare Other | Admitting: Gastroenterology

## 2023-09-14 VITALS — BP 119/79 | HR 67 | Temp 98.6°F | Resp 17 | Ht 72.0 in | Wt 200.0 lb

## 2023-09-14 DIAGNOSIS — D123 Benign neoplasm of transverse colon: Secondary | ICD-10-CM | POA: Diagnosis not present

## 2023-09-14 DIAGNOSIS — Z86010 Personal history of colonic polyps: Secondary | ICD-10-CM

## 2023-09-14 DIAGNOSIS — K635 Polyp of colon: Secondary | ICD-10-CM | POA: Diagnosis not present

## 2023-09-14 DIAGNOSIS — Z09 Encounter for follow-up examination after completed treatment for conditions other than malignant neoplasm: Secondary | ICD-10-CM | POA: Diagnosis not present

## 2023-09-14 MED ORDER — SODIUM CHLORIDE 0.9 % IV SOLN: 500.0000 mL | INTRAVENOUS | Status: DC

## 2023-09-14 NOTE — Patient Instructions (Signed)
Thank you for letting us take care of your healthcare needs today. Please see handouts given to you on Polyps and Hemorrhoids.    YOU HAD AN ENDOSCOPIC PROCEDURE TODAY AT THE [facility]:   Refer to the procedure report that was given to you for any specific questions about what was found during the examination.  If the procedure report does not answer your questions, please call your gastroenterologist to clarify.  If you requested that your care partner not be given the details of your procedure findings, then the procedure report has been included in a sealed envelope for you to review at your convenience later.  YOU SHOULD EXPECT: Some feelings of bloating in the abdomen. Passage of more gas than usual.  Walking can help get rid of the air that was put into your GI tract during the procedure and reduce the bloating. If you had a lower endoscopy (such as a colonoscopy or flexible sigmoidoscopy) you may notice spotting of blood in your stool or on the toilet paper. If you underwent a bowel prep for your procedure, you may not have a normal bowel movement for a few days.  Please Note:  You might notice some irritation and congestion in your nose or some drainage.  This is from the oxygen used during your procedure.  There is no need for concern and it should clear up in a day or so.  SYMPTOMS TO REPORT IMMEDIATELY:  Following lower endoscopy (colonoscopy or flexible sigmoidoscopy):  Excessive amounts of blood in the stool  Significant tenderness or worsening of abdominal pains  Swelling of the abdomen that is new, acute  Fever of 100F or higher   For urgent or emergent issues, a gastroenterologist can be reached at any hour by calling [phone]. Do not use MyChart messaging for urgent concerns.    DIET:  We do recommend a small meal at first, but then you may proceed to your regular diet.  Drink plenty of fluids but you should avoid alcoholic beverages for 24  hours.  ACTIVITY:  You should plan to take it easy for the rest of today and you should NOT DRIVE or use heavy machinery until tomorrow (because of the sedation medicines used during the test).    FOLLOW UP: Our staff will call the number listed on your records the next business day following your procedure.  We will call around 7:15- 8:00 am to check on you and address any questions or concerns that you may have regarding the information given to you following your procedure. If we do not reach you, we will leave a message.     If any biopsies were taken you will be contacted by phone or by letter within the next 1-3 weeks.  Please call us at [phone] if you have not heard about the biopsies in 3 weeks.    SIGNATURES/CONFIDENTIALITY: You and/or your care partner have signed paperwork which will be entered into your electronic medical record.  These signatures attest to the fact that that the information above on your After Visit Summary has been reviewed and is understood.  Full responsibility of the confidentiality of this discharge information lies with you and/or your care-partner.

## 2023-09-14 NOTE — Progress Notes (Signed)
Established Patient Office Visit  Subjective   Patient ID: Russell Gonzalez, male    DOB: September 13, 1956  Age: 67 y.o. MRN: [id number]  No chief complaint on file.   HPI    ROS    Objective:     There were no vitals taken for this visit.   Physical Exam   No results found for any visits on 09/14/23.    The ASCVD Risk score (Arnett DK, et al., 2019) failed to calculate for the following reasons:   Cannot find a previous HDL lab   Cannot find a previous total cholesterol lab    Assessment & Plan:   Problem List Items Addressed This Visit   None   No follow-ups on file.    Jaydee Ingman, CMA

## 2023-09-14 NOTE — Progress Notes (Signed)
Pt's states no medical or surgical changes since previsit or office visit. 

## 2023-09-14 NOTE — Op Note (Signed)
[facility] Patient Name: Russell Gonzalez Procedure Date: 09/14/2023 9:37 AM MRN: [id number] Endoscopist: Sherilyn Cooter L. Myrtie Neither , MD, [id number] Age: 67 Referring MD:  Date of Birth: 10/17/1956 Gender: Male Account #: [id number] Procedure:                Colonoscopy Indications:              Surveillance: Personal history of adenomatous                            polyps on last colonoscopy > 5 years ago                           Diminutive tubular adenoma 2010, no polyps 2013 Medicines:                Monitored Anesthesia Care Procedure:                Pre-Anesthesia Assessment:                           - Prior to the procedure, a History and Physical                            was performed, and patient medications and                            allergies were reviewed. The patient's tolerance of                            previous anesthesia was also reviewed. The risks                            and benefits of the procedure and the sedation                            options and risks were discussed with the patient.                            All questions were answered, and informed consent                            was obtained. Prior Anticoagulants: The patient has                            taken no anticoagulant or antiplatelet agents. ASA                            Grade Assessment: II - A patient with mild systemic                            disease. After reviewing the risks and benefits,                            the patient was deemed in satisfactory condition to  undergo the procedure.                           After obtaining informed consent, the colonoscope                            was passed under direct vision. Throughout the                            procedure, the patient's blood pressure, pulse, and                            oxygen saturations were monitored continuously. The                            Olympus CF-HQ190L  [id number]) Colonoscope was                            introduced through the anus and advanced to the the                            cecum, identified by appendiceal orifice and                            ileocecal valve. The colonoscopy was somewhat                            difficult due to a redundant colon and significant                            looping. Successful completion of the procedure was                            aided by using manual pressure and straightening                            and shortening the scope to obtain bowel loop                            reduction. The patient tolerated the procedure                            well. The quality of the bowel preparation was                            good. The ileocecal valve, appendiceal orifice, and                            rectum were photographed. Scope In: 9:44:45 AM Scope Out: 10:01:39 AM Scope Withdrawal Time: 0 hours 12 minutes 5 seconds  Total Procedure Duration: 0 hours 16 minutes 54 seconds  Findings:                 The perianal and digital rectal examinations were  normal.                           Repeat examination of right colon under NBI                            performed.                           A 6 mm polyp was found in the transverse colon. The                            polyp was semi-sessile. The polyp was removed with                            a cold snare. Resection and retrieval were complete.                           Internal hemorrhoids were found.                           The exam was otherwise without abnormality on                            direct and retroflexion views. Complications:            No immediate complications. Estimated Blood Loss:     Estimated blood loss was minimal. Impression:               - One 6 mm polyp in the transverse colon, removed                            with a cold snare. Resected and retrieved.                            - Internal hemorrhoids.                           - The examination was otherwise normal on direct                            and retroflexion views. Recommendation:           - Patient has a contact number available for                            emergencies. The signs and symptoms of potential                            delayed complications were discussed with the                            patient. Return to normal activities tomorrow.                            Written discharge instructions were provided to the  patient.                           - Resume previous diet.                           - Continue present medications.                           - Await pathology results.                           - Repeat colonoscopy is recommended for                            surveillance. The colonoscopy date will be                            determined after pathology results from today's                            exam become available for review. Jordyn Doane L. Myrtie Neither, MD 09/14/2023 10:05:57 AM This report has been signed electronically.

## 2023-09-14 NOTE — Progress Notes (Signed)
Called to room to assist during endoscopic procedure.  Patient ID and intended procedure confirmed with present staff. Received instructions for my participation in the procedure from the performing physician.  

## 2023-09-14 NOTE — Progress Notes (Signed)
History and Physical:  This patient presents for endoscopic testing for: Encounter Diagnosis  Name Primary?   Personal history of colonic polyps Yes  Surveillance exam Diminutive TA March 2010, no polyps Dec 2013 Jarold Motto)  Patient denies chronic abdominal pain, rectal bleeding, constipation or diarrhea.   Patient is otherwise without complaints or active issues today.   Past Medical History: Past Medical History:  Diagnosis Date   Cancer (HCC) 2009   Basal Cell   History of gastroesophageal reflux (GERD)    History of skin cancer    HTN (hypertension)    Hx of colonic polyps    adenomatous   Hyperlipemia      Past Surgical History: Past Surgical History:  Procedure Laterality Date   COLONOSCOPY     POLYPECTOMY      Adenomatous polyps 2005 &2010, Neg 2013   THYROID LOBECTOMY      Allergies: No Known Allergies  Outpatient Meds: Current Outpatient Medications  Medication Sig Dispense Refill   losartan (COZAAR) 100 MG tablet TAKE ONE TABLET BY MOUTH EVERY DAY for 90 days     diclofenac (VOLTAREN) 50 MG EC tablet      DILT-XR 120 MG 24 hr capsule TAKE 1 CAPSULE DAILY. (Patient not taking: Reported on 08/29/2023) 30 capsule 5   diltiazem (CARDIZEM CD) 120 MG 24 hr capsule Take 1 capsule (120 mg total) by mouth daily. (Patient not taking: Reported on 08/29/2023) 30 capsule 5   erythromycin ophthalmic ointment Place 1 application into the right eye 4 (four) times daily. 3.5 g 0   hydrochlorothiazide (HYDRODIURIL) 25 MG tablet Take 1 tablet (25 mg total) by mouth daily. (Patient not taking: Reported on 08/29/2023) 90 tablet 1   NEXIUM 40 MG capsule TAKE 1 CAPSULE BY MOUTH DAILY BEFORE BREAKFAST AS NEEDED. 90 capsule 0   Current Facility-Administered Medications  Medication Dose Route Frequency Provider Last Rate Last Admin   0.9 %  sodium chloride infusion  500 mL Intravenous Continuous Danis, Starr Lake III, MD           ___________________________________________________________________ Objective   Exam:  BP (!) 142/79   Pulse 67   Temp 98.6 F (37 C)   Ht 6' (1.829 m)   Wt 200 lb (90.7 kg)   SpO2 97%   BMI 27.12 kg/m   CV: regular , S1/S2 Resp: clear to auscultation bilaterally, normal RR and effort noted GI: soft, no tenderness, with active bowel sounds.   Assessment: Encounter Diagnosis  Name Primary?   Personal history of colonic polyps Yes     Plan: Colonoscopy   The benefits and risks of the planned procedure were described in detail with the patient or (when appropriate) their health care proxy.  Risks were outlined as including, but not limited to, bleeding, infection, perforation, adverse medication reaction leading to cardiac or pulmonary decompensation, pancreatitis (if ERCP).  The limitation of incomplete mucosal visualization was also discussed.  No guarantees or warranties were given.  The patient is appropriate for an endoscopic procedure in the ambulatory setting.   - Amada Jupiter, MD

## 2023-09-14 NOTE — Progress Notes (Signed)
Uneventful anesthetic. Report to pacu rn. Vss. Care resumed by rn. 

## 2023-09-15 ENCOUNTER — Telehealth: Payer: Self-pay

## 2023-09-15 NOTE — Telephone Encounter (Signed)
Follow up call to pt, lm for pt to call if having any difficulty with normal activities or eating and drinking.  Also to call if any other questions or concerns.  

## 2023-09-19 LAB — SURGICAL PATHOLOGY

## 2023-09-20 ENCOUNTER — Encounter: Payer: Self-pay | Admitting: Gastroenterology

## 2024-08-10 ENCOUNTER — Other Ambulatory Visit: Payer: Self-pay | Admitting: Medical Genetics

## 2024-10-17 ENCOUNTER — Other Ambulatory Visit: Payer: Self-pay | Admitting: Medical Genetics

## 2024-10-17 DIAGNOSIS — Z006 Encounter for examination for normal comparison and control in clinical research program: Secondary | ICD-10-CM

## 2024-10-22 ENCOUNTER — Other Ambulatory Visit: Payer: Self-pay | Admitting: Medical Genetics

## 2024-10-22 DIAGNOSIS — Z006 Encounter for examination for normal comparison and control in clinical research program: Secondary | ICD-10-CM

## 2024-10-22 NOTE — Addendum Note (Signed)
 Addended by: REMUS NOEMI PARAS on: 10/22/2024 12:40 PM   Modules accepted: Orders

## 2024-11-02 LAB — GENECONNECT MOLECULAR SCREEN

## 2024-11-07 ENCOUNTER — Telehealth: Payer: Self-pay | Admitting: Medical Genetics

## 2024-11-07 NOTE — Telephone Encounter (Signed)
 11/07/24 8:57 AM [facility] TNP notification, 1st attempt, no answer, left voicemail

## 2024-11-27 NOTE — Telephone Encounter (Signed)
 [facility]  11/27/2024 1:49 PM  Confirmed I was speaking with Russell Gonzalez [phone] by using name and DOB. Informed participant the reason for this call is to follow-up on a recent sample the participant provided at one of the [facility] lab locations. Informed participant the test was not able to be completed with this sample and apologized for the inconvenience. Participant was requested to provide a new sample at one of our participating labs at no cost so that participant can continue participation and receive test results. Informed participant they do not need to be fasting and if there are other samples that need to be drawn, they can be done at the same visit. Participant has not had a blood transfusion or blood product in the last 30 days. Participant agreed to provide another sample. Participant was provided the [employer] program website to learn why this may have happened. Participant was thanked for their time and continued support of the above study.    Jordyn Pennstrom, BS [place]  [facility] Clinical Research Specialist II Direct Dial: [phone]  Fax: [phone]

## 2024-12-24 LAB — GENECONNECT MOLECULAR SCREEN: Genetic Analysis Overall Interpretation: NEGATIVE
